# Patient Record
Sex: Male | Born: 1975 | Race: White | Hispanic: No | Marital: Married | Smoking: Former smoker
Health system: Southern US, Community
[De-identification: ages and names within clinical notes are randomized; demographics above are authoritative.]

## PROBLEM LIST (undated history)

## (undated) DIAGNOSIS — G4730 Sleep apnea, unspecified: Secondary | ICD-10-CM

## (undated) DIAGNOSIS — K219 Gastro-esophageal reflux disease without esophagitis: Secondary | ICD-10-CM

## (undated) DIAGNOSIS — G4733 Obstructive sleep apnea (adult) (pediatric): Secondary | ICD-10-CM

## (undated) DIAGNOSIS — R161 Splenomegaly, not elsewhere classified: Secondary | ICD-10-CM

## (undated) DIAGNOSIS — R197 Diarrhea, unspecified: Secondary | ICD-10-CM

## (undated) HISTORY — DX: Gilbert syndrome: E80.4

## (undated) HISTORY — DX: Sleep apnea, unspecified: G47.30

## (undated) HISTORY — DX: Gastro-esophageal reflux disease without esophagitis: K21.9

## (undated) HISTORY — DX: Diarrhea, unspecified: R19.7

## (undated) HISTORY — PX: WISDOM TOOTH EXTRACTION: SHX21

## (undated) HISTORY — PX: COLONOSCOPY: SHX174

## (undated) HISTORY — DX: Splenomegaly, not elsewhere classified: R16.1

## (undated) HISTORY — DX: Obstructive sleep apnea (adult) (pediatric): G47.33

---

## 2005-01-18 HISTORY — PX: CHOLECYSTECTOMY: SHX55

## 2010-06-04 DIAGNOSIS — R079 Chest pain, unspecified: Secondary | ICD-10-CM

## 2010-06-30 ENCOUNTER — Encounter: Payer: Self-pay | Admitting: *Deleted

## 2010-07-07 ENCOUNTER — Ambulatory Visit (INDEPENDENT_AMBULATORY_CARE_PROVIDER_SITE_OTHER): Payer: BC Managed Care – PPO | Admitting: Cardiology

## 2010-07-07 ENCOUNTER — Encounter: Payer: Self-pay | Admitting: Cardiology

## 2010-07-07 DIAGNOSIS — O99334 Smoking (tobacco) complicating childbirth: Secondary | ICD-10-CM

## 2010-07-07 DIAGNOSIS — R079 Chest pain, unspecified: Secondary | ICD-10-CM

## 2010-07-07 NOTE — Assessment & Plan Note (Signed)
I did discuss with him the need to stop all tobacco products.

## 2010-07-07 NOTE — Assessment & Plan Note (Signed)
I think the patient's chest pain is atypical and he had a negative treadmill test. No further cardiovascular testing is suggested. I did suggest aggressive risk reduction given his family history. I am sure that [facility], MD has checked his lipids and I have asked the patient to review these results with a low threshold for statins.

## 2010-07-07 NOTE — Progress Notes (Signed)
HPI The patient for evaluation of chest discomfort. He was hospitalized briefly recently with chest discomfort and ruled out for myocardial infarction. This was about one month ago. It started with his heartbeat in his head followed by headaches. He subsequently had some right-sided chest discomfort at rest it came and went. When he had radiation to his jaw into his right arm he presented to the emergency room. CT Scan demonstrated no pulmonary emboli. He ruled out for myocardial infarction and had a negative adequate exercise treadmill test. Following that he did have some lightheadedness and occasional headaches. He had one episode of left-sided burning discomfort. However, he said no further chest pain, neck or arm discomfort. 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 He said no increased dyspnea, PND or orthopnea. He said no palpitations, presyncope or syncope.  No Known Allergies  Current Outpatient Prescriptions  Medication Sig Dispense Refill  . APAP-Isometheptene-Dichloral (MIDRIN) 325-65-100 MG CAPS Take 1 capsule by mouth as needed.        . clonazePAM (KLONOPIN) 0.5 MG tablet Take 1 tablet by mouth Three times a day.      . cyclobenzaprine (FLEXERIL) 10 MG tablet Take 10 mg by mouth 3 (three) times daily as needed.       Marland Kitchen HYDROcodone-acetaminophen (VICODIN) 5-500 MG per tablet Take 1 tablet by mouth every 6 (six) hours as needed.        . nabumetone (RELAFEN) 500 MG tablet Take 500 mg by mouth 2 (two) times daily.        . sertraline (ZOLOFT) 50 MG tablet Take 1 tablet by mouth Daily.      Marland Kitchen DISCONTD: nabumetone (RELAFEN) 750 MG tablet Take 750 mg by mouth 2 (two) times daily.          Past Medical History  Diagnosis Date  . Diarrhea     predominant irritable syndrome  . Gilbert's disease   . Obstructive sleep apnea     on CPAP  . GERD (gastroesophageal reflux disease)   . Spleen enlarged     Past Surgical History   Procedure Date  . Cholecystectomy 05-31-05  . Wisdom tooth extraction     Family History  Problem Relation Age of Onset  . Heart attack Father 69    multiple stents,passed away in 31-May-2008 with multiple complications  . Multiple sclerosis Father   . Coronary artery disease Maternal Grandmother     bypass surgery times 5 vessel  . Coronary artery disease Maternal Uncle     with stents and bypass  . Heart attack Paternal Grandmother     History   Social History  . Marital Status: Married    Spouse Name: N/A    Number of Children: N/A  . Years of Education: N/A   Occupational History  . Not on file.   Social History Main Topics  . Smoking status: Former Smoker -- 2.0 packs/day for 10 years    Types: Cigarettes    Quit date: 01/19/2008  . Smokeless tobacco: Current User    Types: Snuff   Comment: dip 1.5 cans snuff/day  . Alcohol Use: No  . Drug Use: No  . Sexually Active: Not on file   Other Topics Concern  . Not on file   Social History Narrative   Married with 3 sons ages 85,6,and 2Employed at department of transportationHas 3 brothers and 2 sisters who are overall in good  health    ROS:  As stated in the HPI and negative for all other systems.   PHYSICAL EXAM BP 107/74  Pulse 74  Ht 6\' 1"  (1.854 m)  Wt 197 lb (89.359 kg)  BMI 25.99 kg/m2 GENERAL:  Well appearing HEENT:  Pupils equal round and reactive, fundi not visualized, oral mucosa unremarkable NECK:  No jugular venous distention, waveform within normal limits, carotid upstroke brisk and symmetric, no bruits, no thyromegaly LYMPHATICS:  No cervical, inguinal adenopathy LUNGS:  Clear to auscultation bilaterally BACK:  No CVA tenderness CHEST:  Unremarkable HEART:  PMI not displaced or sustained,S1 and S2 within normal limits, no S3, no S4, no clicks, no rubs, no murmurs ABD:  Flat, positive bowel sounds normal in frequency in pitch, no bruits, no rebound, no guarding, no midline pulsatile mass, no  hepatomegaly, no splenomegaly EXT:  2 plus pulses throughout, no edema, no cyanosis no clubbing SKIN:  No rashes no nodules NEURO:  Cranial nerves II through XII grossly intact, motor grossly intact throughout [facility]:  Cognitively intact, oriented to person place and time  EKG:  06/06/10  Sinus rhythm, rate 97, axis within normal limits, intervals within normal limits, no acute ST-T wave changes  ASSESSMENT AND PLAN

## 2011-07-25 ENCOUNTER — Encounter: Payer: Self-pay | Admitting: Physician Assistant

## 2011-07-25 DIAGNOSIS — R079 Chest pain, unspecified: Secondary | ICD-10-CM

## 2011-07-26 ENCOUNTER — Encounter: Payer: Self-pay | Admitting: Physician Assistant

## 2011-07-26 DIAGNOSIS — R072 Precordial pain: Secondary | ICD-10-CM

## 2011-08-04 ENCOUNTER — Telehealth: Payer: Self-pay | Admitting: *Deleted

## 2011-08-04 NOTE — Telephone Encounter (Signed)
The stress test was negative and there was no objective evidence of ischemia. He needs to see whomever is covering for Dr Neita Carp to evaluate possible nonanginal chest pain.

## 2011-08-04 NOTE — Telephone Encounter (Signed)
Patient calling because he has continued to have issues with chest pain since being d/c from [facility] 07/26/11. Patient is wanting to know what he is supposed to do moving forward about having futher cardiac work up for recommended CTA or heart cath since his stress test was negative. Sasser out of office until tomorrow and patient was informed to call our office.

## 2011-08-05 NOTE — Telephone Encounter (Signed)
Patient informed and verbalized understanding of plan. 

## 2011-08-06 ENCOUNTER — Telehealth: Payer: Self-pay

## 2011-08-06 ENCOUNTER — Encounter: Payer: Self-pay | Admitting: Physician Assistant

## 2011-08-06 ENCOUNTER — Other Ambulatory Visit: Payer: Self-pay | Admitting: Physician Assistant

## 2011-08-06 ENCOUNTER — Ambulatory Visit (INDEPENDENT_AMBULATORY_CARE_PROVIDER_SITE_OTHER): Payer: BC Managed Care – PPO | Admitting: Physician Assistant

## 2011-08-06 ENCOUNTER — Encounter: Payer: Self-pay | Admitting: *Deleted

## 2011-08-06 VITALS — BP 121/86 | HR 81 | Ht 73.0 in | Wt 204.0 lb

## 2011-08-06 DIAGNOSIS — Z01810 Encounter for preprocedural cardiovascular examination: Secondary | ICD-10-CM

## 2011-08-06 DIAGNOSIS — R072 Precordial pain: Secondary | ICD-10-CM

## 2011-08-06 LAB — PROTIME-INR

## 2011-08-06 MED ORDER — NITROGLYCERIN 0.4 MG SL SUBL: 0.4000 mg | SUBLINGUAL_TABLET | SUBLINGUAL | Status: DC | PRN

## 2011-08-06 MED ORDER — ASPIRIN EC 81 MG PO TBEC: 81.0000 mg | DELAYED_RELEASE_TABLET | Freq: Every day | ORAL | Status: AC

## 2011-08-06 NOTE — Assessment & Plan Note (Addendum)
Patient will be scheduled for elective cardiac catheterization JV lab next week, for definitive exclusion of significant CAD. He presents with atypical symptoms, but has had refractory CP since recent hospitalization. He ruled out for MI with normal markers, and had a negative GXT Myoview. He also had a negative GXT in May 2012. He prefers complete reassurance that these symptoms are not cardiac in etiology, fully aware that he does have underlying anxiety disorder. Of note, the risks/benefits of the procedure were discussed with the patient, and Dr. Mady Gemma approved of this plan. Pending completion of his cardiac evaluation, patient will be started on low-dose aspirin and provided with a prescription for prn NTG. If this study is negative, then he is cleared to resume followup with Dr. Neita Carp. If, however, there is evidence of significant CAD, then we will arrange to have the patient resume followup with Dr. Antoine Poche, here in the [place] clinic.

## 2011-08-06 NOTE — Telephone Encounter (Signed)
Pt has [employer], no precert required for heart cath. °

## 2011-08-06 NOTE — Progress Notes (Signed)
Primary Cardiologist: James Hochrein, MD   HPI: Patient is now referred, per Dr. Sasser's request, for recommended cardiac catheterization. He presents with no known CAD, and had a recent in-house evaluation for CP, here at [facility]. Serial cardiac markers were normal and a GXT Myoview, reviewed by Dr. Katz, was negative for evidence of scar/ischemia; EF 52% with normal wall motion.  Since discharge, he has been experiencing CP on a daily basis. He refers to a constant "tightness", as well as intermittent episodes of chest pain, which are unpredictable in onset. He denies any strict correlation with exertion. He suggests that these symptoms are dissimilar from his typical reflux symptoms. He suggests some pleuritic component, but also some exacerbation with moderate exertion. He denies any exacerbation with movement of arms or torso. He reports having undergone prior cholecystectomy.  Cardiac risk factors notable for tobacco (chewing), with remote history of smoking, and FH of premature CAD. He denies HTN, DM, and had an LDL of 103, by recent lipid profile.  Patient was seen by Dr. Sasser in the office yesterday, with then conferred with Dr. Hochrein regarding the patient's complaint of refractory CP. Dr. Hochrein agreed to have the patient seen in our office today, so as to proceed with scheduling for an elective cardiac catheterization procedure.  No Known Allergies  Current Outpatient Prescriptions  Medication Sig Dispense Refill  . clonazePAM (KLONOPIN) 0.5 MG tablet Take 1 tablet by mouth 3 (three) times daily as needed.       . pantoprazole (PROTONIX) 40 MG tablet Take 1 tablet by mouth Daily.      . sertraline (ZOLOFT) 50 MG tablet Take 1 tablet by mouth Daily.        Past Medical History  Diagnosis Date  . Diarrhea     predominant irritable syndrome  . Gilbert's disease   . Obstructive sleep apnea     on CPAP  . GERD (gastroesophageal reflux disease)   . Spleen enlarged      Past Surgical History  Procedure Date  . Cholecystectomy 2007  . Wisdom tooth extraction     History   Social History  . Marital Status: Married    Spouse Name: N/A    Number of Children: N/A  . Years of Education: N/A   Occupational History  . Not on file.   Social History Main Topics  . Smoking status: Former Smoker -- 2.0 packs/day for 10 years    Types: Cigarettes    Quit date: 01/19/2008  . Smokeless tobacco: Current User    Types: Snuff   Comment: dip 1.5 cans snuff/day  . Alcohol Use: No  . Drug Use: No  . Sexually Active: Not on file   Other Topics Concern  . Not on file   Social History Narrative   Married with 3 sons ages 9,6,and 2Employed at department of transportationHas 3 brothers and 2 sisters who are overall in good health   Social History Narrative   Married with 3 sons ages 9,6,and 2Employed at department of transportationHas 3 brothers and 2 sisters who are overall in good health    Problem Relation Age of Onset  . Heart attack Father 50    multiple stents,passed away in 2010 with multiple complications  . Multiple sclerosis Father   . Coronary artery disease Maternal Grandmother     bypass surgery times 5 vessel  . Coronary artery disease Maternal Uncle     with stents and bypass  . Heart attack Paternal Grandmother       ROS: no nausea, vomiting; no fever, chills; no melena, hematochezia; no claudication  PHYSICAL EXAM: BP 121/86  Pulse 81  Ht 6' 1" (1.854 m)  Wt 204 lb (92.534 kg)  BMI 26.91 kg/m2 GENERAL: 36-year-old male; NAD HEENT: NCAT, PERRLA, EOMI; sclera clear; no xanthelasma NECK: palpable bilateral carotid pulses, no bruits; no JVD; no TM LUNGS: CTA bilaterally CARDIAC: RRR (S1, S2); no significant murmurs; no rubs or gallops ABDOMEN: soft, non-tender; intact BS EXTREMETIES: intact distal pulses; no significant peripheral edema SKIN: warm/dry; no obvious rash/lesions MUSCULOSKELETAL: no joint deformity NEURO: no focal  deficit; NL affect   EKG:    ASSESSMENT & PLAN:  Chest pain Patient will be scheduled for elective cardiac catheterization JV lab next week, for definitive exclusion of significant CAD. He presents with atypical symptoms, but has had refractory CP since recent hospitalization. He ruled out for MI with normal markers, and had a negative GXT Myoview. He also had a negative GXT in May 2012. He prefers complete reassurance that these symptoms are not cardiac in etiology, fully aware that he does have underlying anxiety disorder. Of note, the risks/benefits of the procedure were discussed with the patient, and Dr. Mohammed Arida approved of this plan. Pending completion of his cardiac evaluation, patient will be started on low-dose aspirin and provided with a prescription for prn NTG. If this study is negative, then he is cleared to resume followup with Dr. Sasser. If, however, there is evidence of significant CAD, then we will arrange to have the patient resume followup with Dr. Hochrein, here in the [place] clinic.    Shaun Boyer, PAC  

## 2011-08-06 NOTE — Patient Instructions (Addendum)
   Left heat cath - see info sheet given  Begin Aspirin 81mg  daily  Nitroglycerin as needed for severe chest pain only Follow up will be given at time of discharge

## 2011-08-06 NOTE — Telephone Encounter (Signed)
Left heart cath - Tuesday, July 23 - Cooper - 7:30 - JV

## 2011-08-10 ENCOUNTER — Encounter (HOSPITAL_BASED_OUTPATIENT_CLINIC_OR_DEPARTMENT_OTHER)
Admission: RE | Disposition: A | Discharge status: Home / Self Care | Payer: Self-pay | Source: Ambulatory Visit | Attending: Cardiovascular Disease

## 2011-08-10 ENCOUNTER — Inpatient Hospital Stay (HOSPITAL_BASED_OUTPATIENT_CLINIC_OR_DEPARTMENT_OTHER)
Admission: RE | Admit: 2011-08-10 | Discharge: 2011-08-10 | Disposition: A | Discharge status: Home / Self Care | Payer: BC Managed Care – PPO | Source: Ambulatory Visit | Attending: Cardiovascular Disease | Admitting: Cardiovascular Disease

## 2011-08-10 ENCOUNTER — Encounter (HOSPITAL_BASED_OUTPATIENT_CLINIC_OR_DEPARTMENT_OTHER): Payer: Self-pay | Admitting: *Deleted

## 2011-08-10 DIAGNOSIS — R079 Chest pain, unspecified: Secondary | ICD-10-CM | POA: Insufficient documentation

## 2011-08-10 DIAGNOSIS — K219 Gastro-esophageal reflux disease without esophagitis: Secondary | ICD-10-CM | POA: Insufficient documentation

## 2011-08-10 DIAGNOSIS — R161 Splenomegaly, not elsewhere classified: Secondary | ICD-10-CM | POA: Insufficient documentation

## 2011-08-10 DIAGNOSIS — G4733 Obstructive sleep apnea (adult) (pediatric): Secondary | ICD-10-CM | POA: Insufficient documentation

## 2011-08-10 SURGERY — JV LEFT HEART CATHETERIZATION WITH CORONARY ANGIOGRAM
Anesthesia: Moderate Sedation

## 2011-08-10 MED ORDER — ONDANSETRON HCL 4 MG/2ML IJ SOLN: 4.0000 mg | Freq: Four times a day (QID) | INTRAMUSCULAR | Status: DC | PRN

## 2011-08-10 MED ORDER — DIAZEPAM 5 MG PO TABS
5.0000 mg | ORAL_TABLET | Freq: Once | ORAL | Status: AC
  Administered 2011-08-10: 5 mg via ORAL

## 2011-08-10 MED ORDER — ACETAMINOPHEN 325 MG PO TABS: 650.0000 mg | ORAL_TABLET | ORAL | Status: DC | PRN

## 2011-08-10 MED ORDER — SODIUM CHLORIDE 0.9 % IV SOLN: 1.0000 mL/kg/h | INTRAVENOUS | Status: DC

## 2011-08-10 NOTE — OR Nursing (Signed)
Dr Cooper at bedside to discuss results and treatment plan with pt and family 

## 2011-08-10 NOTE — OR Nursing (Signed)
Meal served 

## 2011-08-10 NOTE — OR Nursing (Signed)
Discharge instructions reviewed and signed, pt stated understanding, ambulated in hall without difficulty, site level 0, transported to wife's car via wheelchair 

## 2011-08-10 NOTE — Interval H&P Note (Signed)
History and Physical Interval Note:  08/10/2011 7:43 AM  Shaun Boyer  has presented today for surgery, with the diagnosis of chest pain  The various methods of treatment have been discussed with the patient and family. After consideration of risks, benefits and other options for treatment, the patient has consented to  Procedure(s) (LRB): JV LEFT HEART CATHETERIZATION WITH CORONARY ANGIOGRAM (N/A) as a surgical intervention .  The patient's history has been reviewed, patient examined, no change in status, stable for surgery.  I have reviewed the patient's chart and labs.  Questions were answered to the patient's satisfaction.     [name]  08/10/2011 7:43 AM

## 2011-08-10 NOTE — CV Procedure (Signed)
   Cardiac Catheterization Procedure Note  Name: PATON CRUM MRN: [id number] DOB: January 12, 1976  Procedure: Left Heart Cath, Selective Coronary Angiography, LV angiography  Indication: Recurrent chest pain with multiple cardiovascular risk factors.   Procedural Details: The right wrist was prepped, draped, and anesthetized with 1% lidocaine. Using the modified Seldinger technique, a 5 French sheath was introduced into the right radial artery. 3 mg of verapamil was administered through the sheath, weight-based unfractionated heparin was administered intravenously. Standard Judkins catheters were used for selective coronary angiography and left ventriculography. Catheter exchanges were performed over an exchange length guidewire. There were no immediate procedural complications. A TR band was used for radial hemostasis at the completion of the procedure.  The patient was transferred to the post catheterization recovery area for further monitoring.  Procedural Findings: Hemodynamics: AO 97/67 with a mean of 81 LV 102/16  Coronary angiography: Coronary dominance: right  Left mainstem: Widely patent with no obstructive disease.  Left anterior descending (LAD): The LAD reaches the left ventricular apex. The vessel is widely patent throughout. There is a large first septal perforator. The first diagonal is small. The second diagonal is of moderate caliber. There is no obstructive disease visualized throughout the course of the LAD territory.  Left circumflex (LCx): There is a large intermediate branch that divides into twin vessels. There is no significant disease seen throughout the course of the intermediate. The AV groove circumflex gives off a tiny first OM and a moderate caliber second OM without obstructive CAD.  Right coronary artery (RCA): Large, dominant vessel. There is no obstructive disease present. The vessel supplies the PDA branch and a small posterolateral branch.  Left  ventriculography: Left ventricular systolic function is normal, LVEF is estimated at 55-65%, there is no significant mitral regurgitation   Final Conclusions:   1. Widely patent coronary arteries 2. Normal left ventricular function  Recommendations: Reassurance.  [name] 08/10/2011, 8:13 AM

## 2011-08-10 NOTE — H&P (View-Only) (Signed)
Primary Cardiologist: Rollene Rotunda, MD   HPI: Patient is now referred, per Dr. Dian Situ request, for recommended cardiac catheterization. He presents with no known CAD, and had a recent in-house evaluation for CP, here at [facility]. Serial cardiac markers were normal and a GXT Myoview, reviewed by Dr. Myrtis Ser, was negative for evidence of scar/ischemia; EF 52% with normal wall motion.  Since discharge, he has been experiencing CP on a daily basis. He refers to a constant "tightness", as well as intermittent episodes of chest pain, which are unpredictable in onset. He denies any strict correlation with exertion. He suggests that these symptoms are dissimilar from his typical reflux symptoms. He suggests some pleuritic component, but also some exacerbation with moderate exertion. He denies any exacerbation with movement of arms or torso. He reports having undergone prior cholecystectomy.  Cardiac risk factors notable for tobacco (chewing), with remote history of smoking, and FH of premature CAD. He denies HTN, DM, and had an LDL of 103, by recent lipid profile.  Patient was seen by Dr. Neita Carp in the office yesterday, with then conferred with Dr. Antoine Poche regarding the patient's complaint of refractory CP. Dr. Antoine Poche agreed to have the patient seen in our office today, so as to proceed with scheduling for an elective cardiac catheterization procedure.  No Known Allergies  Current Outpatient Prescriptions  Medication Sig Dispense Refill  . clonazePAM (KLONOPIN) 0.5 MG tablet Take 1 tablet by mouth 3 (three) times daily as needed.       . pantoprazole (PROTONIX) 40 MG tablet Take 1 tablet by mouth Daily.      . sertraline (ZOLOFT) 50 MG tablet Take 1 tablet by mouth Daily.        Past Medical History  Diagnosis Date  . Diarrhea     predominant irritable syndrome  . Gilbert's disease   . Obstructive sleep apnea     on CPAP  . GERD (gastroesophageal reflux disease)   . Spleen enlarged      Past Surgical History  Procedure Date  . Cholecystectomy 07/09/2005  . Wisdom tooth extraction     History   Social History  . Marital Status: Married    Spouse Name: N/A    Number of Children: N/A  . Years of Education: N/A   Occupational History  . Not on file.   Social History Main Topics  . Smoking status: Former Smoker -- 2.0 packs/day for 10 years    Types: Cigarettes    Quit date: 01/19/2008  . Smokeless tobacco: Current User    Types: Snuff   Comment: dip 1.5 cans snuff/day  . Alcohol Use: No  . Drug Use: No  . Sexually Active: Not on file   Other Topics Concern  . Not on file   Social History Narrative   Married with 3 sons ages 1,6,and 2Employed at department of transportationHas 3 brothers and 2 sisters who are overall in good health   Social History Narrative   Married with 3 sons ages 83,6,and 2Employed at department of transportationHas 3 brothers and 2 sisters who are overall in good health    Problem Relation Age of Onset  . Heart attack Father 74    multiple stents,passed away in Jul 09, 2008 with multiple complications  . Multiple sclerosis Father   . Coronary artery disease Maternal Grandmother     bypass surgery times 5 vessel  . Coronary artery disease Maternal Uncle     with stents and bypass  . Heart attack Paternal Grandmother  ROS: no nausea, vomiting; no fever, chills; no melena, hematochezia; no claudication  PHYSICAL EXAM: BP 121/86  Pulse 81  Ht 6\' 1"  (1.854 m)  Wt 204 lb (92.534 kg)  BMI 26.91 kg/m2 GENERAL: 36 year old male; NAD HEENT: NCAT, PERRLA, EOMI; sclera clear; no xanthelasma NECK: palpable bilateral carotid pulses, no bruits; no JVD; no TM LUNGS: CTA bilaterally CARDIAC: RRR (S1, S2); no significant murmurs; no rubs or gallops ABDOMEN: soft, non-tender; intact BS EXTREMETIES: intact distal pulses; no significant peripheral edema SKIN: warm/dry; no obvious rash/lesions MUSCULOSKELETAL: no joint deformity NEURO: no focal  deficit; NL affect   EKG:    ASSESSMENT & PLAN:  Chest pain Patient will be scheduled for elective cardiac catheterization JV lab next week, for definitive exclusion of significant CAD. He presents with atypical symptoms, but has had refractory CP since recent hospitalization. He ruled out for MI with normal markers, and had a negative GXT Myoview. He also had a negative GXT in May 2012. He prefers complete reassurance that these symptoms are not cardiac in etiology, fully aware that he does have underlying anxiety disorder. Of note, the risks/benefits of the procedure were discussed with the patient, and Dr. Mady Gemma approved of this plan. Pending completion of his cardiac evaluation, patient will be started on low-dose aspirin and provided with a prescription for prn NTG. If this study is negative, then he is cleared to resume followup with Dr. Neita Carp. If, however, there is evidence of significant CAD, then we will arrange to have the patient resume followup with Dr. Antoine Poche, here in the [place] clinic.    Gene Tarica Harl, PAC

## 2018-02-16 ENCOUNTER — Ambulatory Visit (INDEPENDENT_AMBULATORY_CARE_PROVIDER_SITE_OTHER): Payer: Managed Care, Other (non HMO) | Admitting: Orthopaedic Surgery

## 2018-02-16 ENCOUNTER — Encounter (INDEPENDENT_AMBULATORY_CARE_PROVIDER_SITE_OTHER): Payer: Self-pay | Admitting: Orthopaedic Surgery

## 2018-02-16 VITALS — BP 127/64 | HR 80 | Ht 73.0 in | Wt 230.0 lb

## 2018-02-16 DIAGNOSIS — M533 Sacrococcygeal disorders, not elsewhere classified: Secondary | ICD-10-CM

## 2018-02-16 DIAGNOSIS — Z720 Tobacco use: Secondary | ICD-10-CM | POA: Diagnosis not present

## 2018-02-16 NOTE — Progress Notes (Signed)
Office Visit Note/orthopedic consultation   Patient: Shaun Boyer           Date of Birth: 1975-05-05           MRN: [id number] Visit Date: 02/16/2018              Requested by: Sasser, Clarene Critchley, MD [address] [place], [place] [address] PCP: Estanislado Pandy, MD   Assessment & Plan: Visit Diagnoses:  1. Coccyx pain   2. Tobacco use     Plan: With patient's intermittent symptoms without area of extreme tenderness with palpation this does not appear to be typical coccydynia.  He may have central disc protrusion with intermittent pain at the midline.  We will try prednisone pack I will recheck him back again in 3 weeks if he is having persistent problems we can consider obtaining MRI scan.  Thank you the opportunity to share in his care and see him in consultation.  Follow-Up Instructions: Return in about 3 weeks (around 03/09/2018).   Orders:  No orders of the defined types were placed in this encounter.  No orders of the defined types were placed in this encounter.     Procedures: No procedures performed   Clinical Data: No additional findings.   Subjective: Chief Complaint  Patient presents with  . Lower Back - Pain    HPI patient here for consultation concerning sacrum and coccyx pain at the request of Dr. Neita Carp.  Patient is a 43 year old male seen with complaints of pain at the sacrum coccyx junction.  Patient tends to come and go at times he rates it severe other times he has no pain.  He is used a donut cushion to some degree.  He was treated with some Augmentin one point for question of possible infection.  Patient works with Education officer, community.  He has been taking some Naprosyn and tramadol.  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  He has had history of disc protrusions related to [employer]. problem this was about 17 years ago.  At that time he had right leg symptoms and was current problem does not involve any symptoms with weakness numbness  or pain in either leg.  No associated bowel or bladder symptoms.  Sometimes notes pain when he is using the toilet sitting.  Patient had previous x-rays of in/01/2017 that showed some Schmorl's nodes in the lumbar spine with normal alignment normal disc space and normal coccyx and sacrum.  Review of Systems positive for a history of disc protrusions.,  Tobacco use, acid reflux and anxiety.  History of migraines and sleep apnea.   Objective: Vital Signs: BP 127/64   Pulse 80   Ht 6\' 1"  (1.854 m)   Wt 230 lb (104.3 kg)   BMI 30.34 kg/m   Physical Exam Constitutional:      Appearance: He is well-developed.  HENT:     Head: Normocephalic and atraumatic.  Eyes:     Pupils: Pupils are equal, round, and reactive to light.  Neck:     Thyroid: No thyromegaly.     Trachea: No tracheal deviation.  Cardiovascular:     Rate and Rhythm: Normal rate.  Pulmonary:     Effort: Pulmonary effort is normal.     Breath sounds: No wheezing.  Abdominal:     General: Bowel sounds are normal.     Palpations: Abdomen is soft.  Skin:    General: Skin is warm and  dry.     Capillary Refill: Capillary refill takes less than 2 seconds.  Neurological:     Mental Status: He is alert and oriented to person, place, and time.  Psychiatric:        Behavior: Behavior normal.        Thought Content: Thought content normal.        Judgment: Judgment normal.     Ortho Exam patient has normal hip range of motion normal gait normal heel toe walking no isolated motor weakness.  Reflexes are 3+ and symmetrical knee and ankle jerk.  Tenderness over the coccyx sacral junction without palpable area of extreme tenderness.  No midline defects, no pilonidal cyst.  Skin is intact.  Specialty Comments:  No specialty comments available.  Imaging: No results found.   PMFS History: Patient Active Problem List   Diagnosis Date Noted  . Tobacco use 02/16/2018  . Chest pain 07/07/2010   Past Medical History:    Diagnosis Date  . Diarrhea    predominant irritable syndrome  . GERD (gastroesophageal reflux disease)   . Gilbert's disease   . Obstructive sleep apnea    on CPAP  . Spleen enlarged     Family History  Problem Relation Age of Onset  . Heart attack Father [id number]       multiple stents,passed away in 2010 with multiple complications  . Multiple sclerosis Father   . Coronary artery disease Maternal Grandmother        bypass surgery times 5 vessel  . Coronary artery disease Maternal Uncle        with stents and bypass  . Heart attack Paternal Grandmother     Past Surgical History:  Procedure Laterality Date  . CHOLECYSTECTOMY  2007  . WISDOM TOOTH EXTRACTION     Social History   Occupational History  . Not on file  Tobacco Use  . Smoking status: Former Smoker    Packs/day: 2.00    Years: 10.00    Pack years: 20.00    Types: Cigarettes    Last attempt to quit: 01/19/2008    Years since quitting: 10.0  . Smokeless tobacco: Current User    Types: Snuff  . Tobacco comment: dip 1.5 cans snuff/day  Substance and Sexual Activity  . Alcohol use: No  . Drug use: No  . Sexual activity: Not on file

## 2018-03-09 ENCOUNTER — Telehealth (INDEPENDENT_AMBULATORY_CARE_PROVIDER_SITE_OTHER): Payer: Self-pay | Admitting: Radiology

## 2018-03-09 ENCOUNTER — Ambulatory Visit (INDEPENDENT_AMBULATORY_CARE_PROVIDER_SITE_OTHER): Payer: BC Managed Care – PPO | Admitting: Orthopaedic Surgery

## 2018-03-09 ENCOUNTER — Encounter (INDEPENDENT_AMBULATORY_CARE_PROVIDER_SITE_OTHER): Payer: Self-pay | Admitting: Orthopaedic Surgery

## 2018-03-09 VITALS — BP 118/93 | HR 74 | Ht 73.0 in | Wt 230.0 lb

## 2018-03-09 DIAGNOSIS — M545 Low back pain, unspecified: Secondary | ICD-10-CM

## 2018-03-09 DIAGNOSIS — M533 Sacrococcygeal disorders, not elsewhere classified: Secondary | ICD-10-CM

## 2018-03-09 MED ORDER — TRAMADOL HCL 50 MG PO TABS: 50.0000 mg | ORAL_TABLET | Freq: Four times a day (QID) | ORAL | 0 refills | Status: DC | PRN

## 2018-03-09 NOTE — Telephone Encounter (Signed)
Patient called and states [facility] does not have the rx for his tramadol refill. Rx info was left on [employer] I called pharmacy. They pulled info off of voicemail and are filling rx.  I called patient and advised.

## 2018-03-09 NOTE — Progress Notes (Signed)
Office Visit Note   Patient: Shaun Boyer           Date of Birth: 1975-11-17           MRN: [id number] Visit Date: 03/09/2018              Requested by: Sasser, Clarene Critchley, MD [address] [place], [place] [address] PCP: Estanislado Pandy, MD   Assessment & Plan: Visit Diagnoses:  1. Coccyx pain   2. Midline low back pain, unspecified chronicity, unspecified whether sciatica present     Plan: Patient has a past history of disc protrusions's having severe pain is bothering him with working.  He is having problems sitting walking standing.  He got some temporary relief with the prednisone and now is a significant pain again currently.  We will proceed with MRI scan lumbar and office follow-up after scan for review.  Follow-Up Instructions: No follow-ups on file.   Orders:  Orders Placed This Encounter  Procedures  . MR Lumbar Spine w/o contrast   Meds ordered this encounter  Medications  . traMADol (ULTRAM) 50 MG tablet    Sig: Take 1 tablet (50 mg total) by mouth every 6 (six) hours as needed.    Dispense:  30 tablet    Refill:  0      Procedures: No procedures performed   Clinical Data: No additional findings.   Subjective: Chief Complaint  Patient presents with  . Lower Back - Pain, Follow-up    Coccyx pain    HPI 43 year old male returns with ongoing severe sacral and coccyx pain.  He took the prednisone Dosepak states he was significantly better but his symptoms the prednisone ran out he had recurrence of pain exactly as before.  He has pain tends to come and go and that it worse is severe he is used a donut cushion.  He took some Augmentin for possible prostatitis without relief he is used Naprosyn as well as tramadol for pain prednisone Dosepak as listed above.  Past history of disc protrusion about 17 years ago with the Worker's Comp. related injury.  No dysuria no urgency no urinary frequency.  Denies fever or chills.  Review of Systems positive tobacco abuse acid  reflux anxiety history of migraine sleep apnea previous disc protrusions noted on MRI about 17 years ago.   Objective: Vital Signs: BP (!) 118/93   Pulse 74   Ht 6\' 1"  (1.854 m)   Wt 230 lb (104.3 kg)   BMI 30.34 kg/m   Physical Exam Constitutional:      Appearance: He is well-developed.  HENT:     Head: Normocephalic and atraumatic.  Eyes:     Pupils: Pupils are equal, round, and reactive to light.  Neck:     Thyroid: No thyromegaly.     Trachea: No tracheal deviation.  Cardiovascular:     Rate and Rhythm: Normal rate.  Pulmonary:     Effort: Pulmonary effort is normal.     Breath sounds: No wheezing.  Abdominal:     General: Bowel sounds are normal.     Palpations: Abdomen is soft.  Skin:    General: Skin is warm and dry.     Capillary Refill: Capillary refill takes less than 2 seconds.  Neurological:     Mental Status: He is alert and oriented to person, place, and time.  Psychiatric:        Behavior: Behavior normal.  Thought Content: Thought content normal.        Judgment: Judgment normal.     Ortho Exam patient has pain with palpation of the sacrum and coccyx sciatic notch tenderness right and left.  Reflexes are 3+ knee and ankle jerk right and left.  Anterior tib gastrocsoleus is strong.  He still able heel and toe walk.  Specialty Comments:  No specialty comments available.  Imaging: No results found.   PMFS History: Patient Active Problem List   Diagnosis Date Noted  . Tobacco use 02/16/2018  . Chest pain 07/07/2010   Past Medical History:  Diagnosis Date  . Diarrhea    predominant irritable syndrome  . GERD (gastroesophageal reflux disease)   . Gilbert's disease   . Obstructive sleep apnea    on CPAP  . Spleen enlarged     Family History  Problem Relation Age of Onset  . Heart attack Father 41       multiple stents,passed away in 2008/05/18 with multiple complications  . Multiple sclerosis Father   . Coronary artery disease Maternal  Grandmother        bypass surgery times 5 vessel  . Coronary artery disease Maternal Uncle        with stents and bypass  . Heart attack Paternal Grandmother     Past Surgical History:  Procedure Laterality Date  . CHOLECYSTECTOMY  2005/05/18  . WISDOM TOOTH EXTRACTION     Social History   Occupational History  . Not on file  Tobacco Use  . Smoking status: Former Smoker    Packs/day: 2.00    Years: 10.00    Pack years: 20.00    Types: Cigarettes    Last attempt to quit: 01/19/2008    Years since quitting: 10.1  . Smokeless tobacco: Current User    Types: Snuff  . Tobacco comment: dip 1.5 cans snuff/day  Substance and Sexual Activity  . Alcohol use: No  . Drug use: No  . Sexual activity: Not on file

## 2018-03-17 ENCOUNTER — Telehealth (INDEPENDENT_AMBULATORY_CARE_PROVIDER_SITE_OTHER): Payer: Self-pay | Admitting: Radiology

## 2018-03-17 NOTE — Telephone Encounter (Signed)
Still awaiting denial documentation.

## 2018-03-17 NOTE — Telephone Encounter (Signed)
MRI Lumbar Spine was denied by patient's insurance. Shaun Boyer called patient and advised, and he questioned paying for the MRI out of pocket. He then called his insurance company who states that Dr. Ophelia Charter can do a peer to peer to get it approved. Shaun Boyer has called x2 but has still not received denial paperwork, only the phone call.  She does not have documentation on why the scan has been denied.  Will continue to check for denial paperwork to initiate next step for patient.     Case# [id number] Fax [phone]

## 2018-03-20 NOTE — Telephone Encounter (Signed)
Still have not received denial per [facility] in [place] office.

## 2018-03-23 ENCOUNTER — Ambulatory Visit (INDEPENDENT_AMBULATORY_CARE_PROVIDER_SITE_OTHER): Payer: Managed Care, Other (non HMO) | Admitting: Orthopaedic Surgery

## 2018-03-23 ENCOUNTER — Encounter (INDEPENDENT_AMBULATORY_CARE_PROVIDER_SITE_OTHER): Payer: Self-pay | Admitting: Orthopaedic Surgery

## 2018-03-23 VITALS — BP 126/91 | HR 90 | Ht 73.0 in | Wt 230.0 lb

## 2018-03-23 DIAGNOSIS — M545 Low back pain, unspecified: Secondary | ICD-10-CM

## 2018-03-23 NOTE — Progress Notes (Signed)
Office Visit Note   Patient: Shaun Boyer           Date of Birth: [birth year]           MRN: [id number] Visit Date: 03/23/2018              Requested by: Sasser, Clarene Critchley, MD [address] [place], [place] [address] PCP: Estanislado Pandy, MD   Assessment & Plan: Visit Diagnoses:  1. Midline low back pain without sciatica, unspecified chronicity     Plan: Patient is now had persistent pain for 2 to 3 months is gotten worse to the point where he he is missing work.  He was originally thought to might have some prostatitis was treated with antibiotics and then has been on anti-inflammatories for several months.  Is been 5 weeks since I first saw him and he has been on a prednisone Dosepak and then restarted the Naprosyn without relief.  MRI scan is indicated and we will resubmit the request.  Follow-Up Instructions: No follow-ups on file.   Orders:  No orders of the defined types were placed in this encounter.  No orders of the defined types were placed in this encounter.     Procedures: No procedures performed   Clinical Data: No additional findings.   Subjective: Chief Complaint  Patient presents with  . Lower Back - Pain, Follow-up    HPI 43 year old male returns with ongoing problems with back pain.  He states the pain is been severe and has been bad enough he had been able to work.  He is been having problems since I first saw him on 02/16/1998 20 pound in 5 weeks is been taking anti-inflammatories.  He states the pain is not better is having trouble sleeping problems walking.  He denies chills or fever no history of cancer.  He is now been 5 weeks with treatment with Naprosyn without improvement.  Patient was placed on a prednisone Dosepak and did not get relief.  He does have past history of problems with disc protrusion from an on-the-job Worker's Comp. injury which was about 17 years ago.  His pain is primarily midline sacrum and coccyx and he rates it at times is  severe.  Review of Systems updated unchanged from 130/20.  History of previous disc protrusion tobacco use acid reflux sleep apnea migraines and anxiety.   Objective: Vital Signs: BP (!) 126/91   Pulse 90   Ht 6\' 1"  (1.854 m)   Wt 230 lb (104.3 kg)   BMI 30.34 kg/m   Physical Exam Constitutional:      Appearance: He is well-developed.  HENT:     Head: Normocephalic and atraumatic.  Eyes:     Pupils: Pupils are equal, round, and reactive to light.  Neck:     Thyroid: No thyromegaly.     Trachea: No tracheal deviation.  Cardiovascular:     Rate and Rhythm: Normal rate.  Pulmonary:     Effort: Pulmonary effort is normal.     Breath sounds: No wheezing.  Abdominal:     General: Bowel sounds are normal.     Palpations: Abdomen is soft.  Skin:    General: Skin is warm and dry.     Capillary Refill: Capillary refill takes less than 2 seconds.  Neurological:     Mental Status: He is alert and oriented to person, place, and time.  Psychiatric:        Behavior: Behavior normal.  Thought Content: Thought content normal.        Judgment: Judgment normal.     Ortho Exam patient has good hip range of motion reflexes are intact.  He has pain with certain lumbar extension no pain with forward flexion.  Sensation is intact distal pulses are intact.  Anterior tib gastrocsoleus is active.  Specialty Comments:  No specialty comments available.  Imaging: No results found.   PMFS History: Patient Active Problem List   Diagnosis Date Noted  . Tobacco use 02/16/2018  . Chest pain 07/07/2010   Past Medical History:  Diagnosis Date  . Diarrhea    predominant irritable syndrome  . GERD (gastroesophageal reflux disease)   . Gilbert's disease   . Obstructive sleep apnea    on CPAP  . Spleen enlarged     Family History  Problem Relation Age of Onset  . Heart attack Father 63       multiple stents,passed away in May 07, 2008 with multiple complications  . Multiple sclerosis  Father   . Coronary artery disease Maternal Grandmother        bypass surgery times 5 vessel  . Coronary artery disease Maternal Uncle        with stents and bypass  . Heart attack Paternal Grandmother     Past Surgical History:  Procedure Laterality Date  . CHOLECYSTECTOMY  05/07/05  . WISDOM TOOTH EXTRACTION     Social History   Occupational History  . Not on file  Tobacco Use  . Smoking status: Former Smoker    Packs/day: 2.00    Years: 10.00    Pack years: 20.00    Types: Cigarettes    Last attempt to quit: 01/19/2008    Years since quitting: 10.1  . Smokeless tobacco: Current User    Types: Snuff  . Tobacco comment: dip 1.5 cans snuff/day  Substance and Sexual Activity  . Alcohol use: No  . Drug use: No  . Sexual activity: Not on file

## 2018-03-23 NOTE — Telephone Encounter (Signed)
Patient came in for appointment today. Dr. Ophelia Charter spoke with patient in regards to treatment and Claris Gower called the insurance company again. We will resubmit the MRI for authorization next week which will mark the 6 weeks since patient has been seeing Dr. Ophelia Charter. Patient is aware. Claris Gower has all information to be resubmitted.

## 2018-04-06 ENCOUNTER — Encounter (INDEPENDENT_AMBULATORY_CARE_PROVIDER_SITE_OTHER): Payer: Self-pay | Admitting: Orthopaedic Surgery

## 2018-04-06 ENCOUNTER — Ambulatory Visit (INDEPENDENT_AMBULATORY_CARE_PROVIDER_SITE_OTHER): Payer: Managed Care, Other (non HMO) | Admitting: Orthopaedic Surgery

## 2018-04-06 ENCOUNTER — Other Ambulatory Visit: Payer: Self-pay

## 2018-04-06 VITALS — Ht 73.0 in | Wt 230.0 lb

## 2018-04-06 DIAGNOSIS — M545 Low back pain, unspecified: Secondary | ICD-10-CM

## 2018-04-06 DIAGNOSIS — M5126 Other intervertebral disc displacement, lumbar region: Secondary | ICD-10-CM | POA: Diagnosis not present

## 2018-04-06 NOTE — Progress Notes (Signed)
Office Visit Note   Patient: Shaun Boyer           Date of Birth: 1975/12/15           MRN: [id number] Visit Date: 04/06/2018              Requested by: Sasser, Clarene Critchley, MD [address] [place], [place] [address] PCP: Estanislado Pandy, MD   Assessment & Plan: Visit Diagnoses:  1. Low back pain without sciatica, unspecified back pain laterality, unspecified chronicity   2. Protrusion of lumbar intervertebral disc     Plan: We discussed he needs to lose some weight stop smoking.  We will set him up for some physical therapy.  I discussed with him with absence of compressive lesions he needs to avoid narcotic medication and problems with prolonged narcotics discussed.  If the therapy smoking cessation and weight loss not effective will consider single epidural likely at the L5-S1 level centrally.  Recheck 7 weeks.  Follow-Up Instructions: Return in about 7 weeks (around 05/25/2018).   Orders:  No orders of the defined types were placed in this encounter.  No orders of the defined types were placed in this encounter.     Procedures: No procedures performed   Clinical Data: No additional findings.   Subjective: Chief Complaint  Patient presents with  . Lower Back - Pain    Lumbar MRI Review    HPI 43 year old male seen with continued low back pain axial radiates down to the coccyx.  Has no pain with rotation.  Denies any radicular leg symptoms.  MRI scan has been obtained and shows shallow right subarticular disc protrusion at L5-S1 contacting the nerve root without displacement.  Mild disc bulge with facet hypertrophy at L4-5 with mild right foraminal narrowing.  Tiny central disc extrusion at L1-2 without contact or stenosis.  No fever chills no bowel or bladder symptoms.  He is a smoker.  Prednisone did not give him relief.  He got some relief with tramadol.  He states he is almost out.  Review of Systems positive for tobacco abuse, smoking.  Chronic low back pain.  Positive for  anxiety, sleep apnea, migraines.   Objective: Vital Signs: Ht 6\' 1"  (1.854 m)   Wt 230 lb (104.3 kg)   BMI 30.34 kg/m   Physical Exam Constitutional:      Appearance: He is well-developed.  HENT:     Head: Normocephalic and atraumatic.  Eyes:     Pupils: Pupils are equal, round, and reactive to light.  Neck:     Thyroid: No thyromegaly.     Trachea: No tracheal deviation.  Cardiovascular:     Rate and Rhythm: Normal rate.  Pulmonary:     Effort: Pulmonary effort is normal.     Breath sounds: No wheezing.  Abdominal:     General: Bowel sounds are normal.     Palpations: Abdomen is soft.  Skin:    General: Skin is warm and dry.     Capillary Refill: Capillary refill takes less than 2 seconds.  Neurological:     Mental Status: He is alert and oriented to person, place, and time.  Psychiatric:        Behavior: Behavior normal.        Thought Content: Thought content normal.        Judgment: Judgment normal.     Ortho Exam patient is able heel and toe walk ambulatory without a limp gets from sitting to  standing comfortably.  Anterior tib gastrocsoleus is intact no atrophy no rash over exposed skin.  Specialty Comments:  No specialty comments available.  Imaging: No results found.   PMFS History: Patient Active Problem List   Diagnosis Date Noted  . Tobacco use 02/16/2018  . Chest pain 07/07/2010   Past Medical History:  Diagnosis Date  . Diarrhea    predominant irritable syndrome  . GERD (gastroesophageal reflux disease)   . Gilbert's disease   . Obstructive sleep apnea    on CPAP  . Spleen enlarged     Family History  Problem Relation Age of Onset  . Heart attack Father 68       multiple stents,passed away in 2008-05-12 with multiple complications  . Multiple sclerosis Father   . Coronary artery disease Maternal Grandmother        bypass surgery times 5 vessel  . Coronary artery disease Maternal Uncle        with stents and bypass  . Heart attack  Paternal Grandmother     Past Surgical History:  Procedure Laterality Date  . CHOLECYSTECTOMY  05/12/05  . WISDOM TOOTH EXTRACTION     Social History   Occupational History  . Not on file  Tobacco Use  . Smoking status: Former Smoker    Packs/day: 2.00    Years: 10.00    Pack years: 20.00    Types: Cigarettes    Last attempt to quit: 01/19/2008    Years since quitting: 10.2  . Smokeless tobacco: Current User    Types: Snuff  . Tobacco comment: dip 1.5 cans snuff/day  Substance and Sexual Activity  . Alcohol use: No  . Drug use: No  . Sexual activity: Not on file

## 2018-05-25 ENCOUNTER — Ambulatory Visit: Payer: Self-pay | Admitting: Orthopaedic Surgery

## 2020-07-03 ENCOUNTER — Emergency Department (HOSPITAL_COMMUNITY)
Admission: EM | Admit: 2020-07-03 | Discharge: 2020-07-04 | Disposition: A | Discharge status: Home / Self Care | Payer: Managed Care, Other (non HMO) | Attending: Emergency Medicine | Admitting: Emergency Medicine

## 2020-07-03 ENCOUNTER — Other Ambulatory Visit: Payer: Self-pay

## 2020-07-03 DIAGNOSIS — K5792 Diverticulitis of intestine, part unspecified, without perforation or abscess without bleeding: Secondary | ICD-10-CM | POA: Diagnosis not present

## 2020-07-03 DIAGNOSIS — R1032 Left lower quadrant pain: Secondary | ICD-10-CM | POA: Diagnosis present

## 2020-07-03 DIAGNOSIS — Z87891 Personal history of nicotine dependence: Secondary | ICD-10-CM | POA: Diagnosis not present

## 2020-07-03 LAB — URINALYSIS, ROUTINE W REFLEX MICROSCOPIC
Bacteria, UA: NONE SEEN
Bilirubin Urine: NEGATIVE
Glucose, UA: NEGATIVE mg/dL
Hgb urine dipstick: NEGATIVE
Ketones, ur: NEGATIVE mg/dL
Leukocytes,Ua: NEGATIVE
Nitrite: NEGATIVE
Protein, ur: NEGATIVE mg/dL
Specific Gravity, Urine: 1.018 (ref 1.005–1.030)
pH: 6 (ref 5.0–8.0)

## 2020-07-03 LAB — CBC WITH DIFFERENTIAL/PLATELET
Abs Immature Granulocytes: 0.03 10*3/uL (ref 0.00–0.07)
Basophils Absolute: 0 10*3/uL (ref 0.0–0.1)
Basophils Relative: 0 %
Eosinophils Absolute: 0.2 10*3/uL (ref 0.0–0.5)
Eosinophils Relative: 2 %
HCT: 45.9 % (ref 39.0–52.0)
Hemoglobin: 15.9 g/dL (ref 13.0–17.0)
Immature Granulocytes: 0 %
Lymphocytes Relative: 16 %
Lymphs Abs: 1.7 10*3/uL (ref 0.7–4.0)
MCH: 29.4 pg (ref 26.0–34.0)
MCHC: 34.6 g/dL (ref 30.0–36.0)
MCV: 84.8 fL (ref 80.0–100.0)
Monocytes Absolute: 0.8 10*3/uL (ref 0.1–1.0)
Monocytes Relative: 7 %
Neutro Abs: 7.8 10*3/uL — ABNORMAL HIGH (ref 1.7–7.7)
Neutrophils Relative %: 75 %
Platelets: 252 10*3/uL (ref 150–400)
RBC: 5.41 MIL/uL (ref 4.22–5.81)
RDW: 13.1 % (ref 11.5–15.5)
WBC: 10.5 10*3/uL (ref 4.0–10.5)
nRBC: 0 % (ref 0.0–0.2)

## 2020-07-03 LAB — COMPREHENSIVE METABOLIC PANEL
ALT: 40 U/L (ref 0–44)
AST: 20 U/L (ref 15–41)
Albumin: 4.5 g/dL (ref 3.5–5.0)
Alkaline Phosphatase: 80 U/L (ref 38–126)
Anion gap: 10 (ref 5–15)
BUN: 10 mg/dL (ref 6–20)
CO2: 25 mmol/L (ref 22–32)
Calcium: 9.4 mg/dL (ref 8.9–10.3)
Chloride: 103 mmol/L (ref 98–111)
Creatinine, Ser: 1.02 mg/dL (ref 0.61–1.24)
GFR, Estimated: >60 mL/min (ref >60)
Glucose, Bld: 102 mg/dL — ABNORMAL HIGH (ref 70–99)
Potassium: 3.2 mmol/L — ABNORMAL LOW (ref 3.5–5.1)
Sodium: 138 mmol/L (ref 135–145)
Total Bilirubin: 3.5 mg/dL — ABNORMAL HIGH (ref 0.3–1.2)
Total Protein: 7.3 g/dL (ref 6.5–8.1)

## 2020-07-03 LAB — LIPASE, BLOOD: Lipase: 29 U/L (ref 11–51)

## 2020-07-03 MED ORDER — MORPHINE SULFATE (PF) 4 MG/ML IV SOLN
4.0000 mg | Freq: Once | INTRAVENOUS | Status: AC
  Administered 2020-07-03: 4 mg via INTRAVENOUS
  Filled 2020-07-03: qty 1

## 2020-07-03 NOTE — ED Triage Notes (Signed)
Pt c/o abdominal pain that differs from usual diverticulitis dx & after going to family doctor, worried for rupture. On augmentin for abscessed tooth, PCP concerned, requested pt come to ED for imaging & tx.

## 2020-07-03 NOTE — ED Provider Notes (Signed)
[facility] EMERGENCY DEPARTMENT Provider Note   CSN: [id number] Arrival date & time: 07/03/20  26-May-2002     History Chief Complaint  Patient presents with   Diverticulitis inflammation    Shaun Boyer is a 45 y.o. male.  The history is provided by the patient and medical records. No language interpreter was used.  Abdominal Pain Pain location:  LLQ Pain quality: aching   Pain radiates to:  L flank Pain severity:  Severe Onset quality:  Gradual Duration:  1 day Timing:  Constant Progression:  Unchanged Chronicity:  New Context: recent illness (recent dental infection)   Context: not trauma   Relieved by:  Nothing Worsened by:  Palpation Associated symptoms: no chest pain, no chills, no constipation, no cough, no diarrhea, no dysuria, no fatigue, no fever, no nausea, no shortness of breath and no vomiting       Past Medical History:  Diagnosis Date   Diarrhea    predominant irritable syndrome   GERD (gastroesophageal reflux disease)    Gilbert's disease    Obstructive sleep apnea    on CPAP   Spleen enlarged     Patient Active Problem List   Diagnosis Date Noted   Tobacco use 02/16/2018   Chest pain 07/07/2010    Past Surgical History:  Procedure Laterality Date   CHOLECYSTECTOMY  05-25-05   WISDOM TOOTH EXTRACTION         Family History  Problem Relation Age of Onset   Heart attack Father 87       multiple stents,passed away in May 25, 2008 with multiple complications   Multiple sclerosis Father    Coronary artery disease Maternal Grandmother        bypass surgery times 5 vessel   Coronary artery disease Maternal Uncle        with stents and bypass   Heart attack Paternal Grandmother     Social History   Tobacco Use   Smoking status: Former    Packs/day: 2.00    Years: 10.00    Pack years: 20.00    Types: Cigarettes    Quit date: 01/19/2008    Years since quitting: 12.4   Smokeless tobacco: Current    Types: Snuff   Tobacco  comments:    dip 1.5 cans snuff/day  Substance Use Topics   Alcohol use: No   Drug use: No    Home Medications Prior to Admission medications   Medication Sig Start Date End Date Taking? Authorizing Provider  clonazePAM (KLONOPIN) 0.5 MG tablet Take 1 tablet by mouth 3 (three) times daily as needed.  06/18/10   [provider]  naproxen (NAPROSYN) 500 MG tablet  01/02/18   [provider]  nitroGLYCERIN (NITROSTAT) 0.4 MG SL tablet Place 1 tablet (0.4 mg total) under the tongue every 5 (five) minutes as needed for chest pain. 08/06/11 08/05/12  Serpe, Clide Deutscher, PA-C  pantoprazole (PROTONIX) 40 MG tablet Take 1 tablet by mouth Daily. 07/19/11   [provider]  predniSONE (STERAPRED UNI-PAK 21 TAB) 10 MG (21) TBPK tablet See admin instructions. see package 02/16/18   [provider]  sertraline (ZOLOFT) 50 MG tablet Take 1 tablet by mouth Daily. 06/18/10   [provider]  traMADol (ULTRAM) 50 MG tablet Take 1 tablet (50 mg total) by mouth every 6 (six) hours as needed. 03/09/18   Eldred Manges, MD    Allergies    Patient has no known allergies.  Review of Systems  Review of Systems  Constitutional:  Negative for chills, fatigue and fever.  HENT:  Negative for congestion.   Eyes:  Negative for visual disturbance.  Respiratory:  Negative for cough, chest tightness and shortness of breath.   Cardiovascular:  Negative for chest pain.  Gastrointestinal:  Positive for abdominal pain. Negative for abdominal distention, constipation, diarrhea, nausea and vomiting.  Genitourinary:  Positive for flank pain. Negative for decreased urine volume, dysuria, frequency, genital sores, penile pain, testicular pain and urgency.  Musculoskeletal:  Negative for back pain (l flank) and neck pain.  Skin:  Negative for rash and wound.  Neurological:  Negative for headaches.  Psychiatric/Behavioral:  Negative for agitation.   All other systems reviewed and are  negative.  Physical Exam Updated Vital Signs BP (!) 153/107   Pulse (!) 103   Temp 98.2 F (36.8 C) (Oral)   Resp 16   SpO2 98%   Physical Exam Vitals and nursing note reviewed.  Constitutional:      General: He is not in acute distress.    Appearance: He is well-developed. He is not ill-appearing, toxic-appearing or diaphoretic.  HENT:     Head: Normocephalic and atraumatic.  Eyes:     Conjunctiva/sclera: Conjunctivae normal.  Cardiovascular:     Rate and Rhythm: Normal rate and regular rhythm.     Heart sounds: No murmur heard. Pulmonary:     Effort: Pulmonary effort is normal. No respiratory distress.     Breath sounds: Normal breath sounds. No wheezing, rhonchi or rales.  Chest:     Chest wall: No tenderness.  Abdominal:     General: Abdomen is flat.     Palpations: Abdomen is soft.     Tenderness: There is abdominal tenderness. There is no right CVA tenderness, left CVA tenderness, guarding or rebound.  Genitourinary:    Comments: Deferred by patient, no inguinal hernia palpated initially.  Musculoskeletal:        General: Tenderness present.     Cervical back: Neck supple.     Right lower leg: No edema.     Left lower leg: No edema.  Skin:    General: Skin is warm and dry.     Capillary Refill: Capillary refill takes less than 2 seconds.     Findings: No erythema.  Neurological:     General: No focal deficit present.     Mental Status: He is alert.  Psychiatric:        Mood and Affect: Mood normal.    ED Results / Procedures / Treatments   Labs (all labs ordered are listed, but only abnormal results are displayed) Labs Reviewed  CBC WITH DIFFERENTIAL/PLATELET - Abnormal; Notable for the following components:      Result Value   Neutro Abs 7.8 (*)    All other components within normal limits  COMPREHENSIVE METABOLIC PANEL - Abnormal; Notable for the following components:   Potassium 3.2 (*)    Glucose, Bld 102 (*)    Total Bilirubin 3.5 (*)    All  other components within normal limits  URINE CULTURE  LIPASE, BLOOD  URINALYSIS, ROUTINE W REFLEX MICROSCOPIC    EKG None  Radiology CT ABDOMEN PELVIS W CONTRAST  Result Date: 07/04/2020 CLINICAL DATA:  Abdominal pain with history of diverticulitis EXAM: CT ABDOMEN AND PELVIS WITH CONTRAST TECHNIQUE: Multidetector CT imaging of the abdomen and pelvis was performed using the standard protocol following bolus administration of intravenous contrast. CONTRAST:  OMNIPAQUE IOHEXOL 300 MG/ML  SOLN  COMPARISON:  07/13/2019 FINDINGS: LOWER CHEST: Normal. HEPATOBILIARY: Normal hepatic contours. No intra- or extrahepatic biliary dilatation. Status post cholecystectomy. PANCREAS: Normal pancreas. No ductal dilatation or peripancreatic fluid collection. SPLEEN: Normal. ADRENALS/URINARY TRACT: The adrenal glands are normal. No hydronephrosis, nephroureterolithiasis or solid renal mass. The urinary bladder is normal for degree of distention STOMACH/BOWEL: There is no hiatal hernia. Normal duodenal course and caliber. No small bowel dilatation or inflammation. Sigmoid diverticulosis with acute inflammation. No free intraperitoneal air or fluid collection. Normal appendix. VASCULAR/LYMPHATIC: Normal course and caliber of the major abdominal vessels. No abdominal or pelvic lymphadenopathy. REPRODUCTIVE: There are calcifications within the normal-sized prostate. Symmetric seminal vesicles. MUSCULOSKELETAL. No bony spinal canal stenosis or focal osseous abnormality. OTHER: None. IMPRESSION: Acute sigmoid diverticulitis without abscess or free intraperitoneal air. Electronically Signed   By: Deatra Robinson M.D.   On: 07/04/2020 00:51    Procedures Procedures   Medications Ordered in ED Medications  morphine 4 MG/ML injection 4 mg (has no administration in time range)    ED Course  I have reviewed the triage vital signs and the nursing notes.  Pertinent labs & imaging results that were available during my  care of the patient were reviewed by me and considered in my medical decision making (see chart for details).    MDM Rules/Calculators/A&P                          Shaun Boyer is a 45 y.o. male with a past medical history significant for Gilbert's disease, prior cholecystectomy, GERD, irritable bowel syndrome, diverticulitis, and current on antibiotics for dental infection who presents with left lower quadrant abdominal pain today.  Patient spoke to his PCP today who told him to come in for evaluation and rule out a perforated diverticulitis.  According to patient, he has been on antibiotics for around 7 days for dental infection.  He does not report that his worsen or any new symptoms without.  Denies fevers or chills but reports he has had left lower quadrant abdominal pain starting today that is up to 10 out of 10 in severity.  It is now a 7 out of 10.  He reports no nausea or vomiting and reports has had 4 normal bowel movements today without any bleeding constipation or diarrhea.  Denies any urinary changes and denies any history of kidney stones.  Reports the pain does go from the left lower quadrant around towards his left flank and towards his left inguinal area.  Denies any scrotal or testicle pain.  Denies any trauma.  Denies any rashes.  Denies any other complaints.  On exam, lungs are clear and chest is nontender.  Abdomen is tender in the left lower quadrant.  Left CVA area nontender the left flank is tender.  Normal bowel sounds.  Exam otherwise unremarkable.  Patient was seen in triage and ha screening labs and a CT abdomen pelvis ordered.  I do agree with a CT on pelvis with contrast to look for acute diverticulitiss versus kidney stone versus intermittent inguinal hernia that was not palpated on my exam.  He will remain n.p.o., get some pain medicine, and have the labs.  Anticipate reassessment after workup.   Patient had uncomplicated diverticulitis.  No perforation or  abscess.  Patient reported feeling better with medications.  Spoke with pharmacy who recommended adding Levaquin to his Augmentin and follow-up with PCP.  Patient agrees with plan of care and had no other questions  or concerns.  Patient discharged in good condition with return precautions understood.    Final Clinical Impression(s) / ED Diagnoses Final diagnoses:  Diverticulitis  LLQ abdominal pain    Rx / DC Orders ED Discharge Orders          Ordered    levofloxacin (LEVAQUIN) 750 MG tablet  Daily        07/04/20 0333    oxyCODONE-acetaminophen (PERCOCET/ROXICET) 5-325 MG tablet  Every 4 hours PRN        07/04/20 0333    ondansetron (ZOFRAN) 4 MG tablet  Every 8 hours PRN        07/04/20 0333           Clinical Impression: 1. Diverticulitis   2. LLQ abdominal pain     Disposition: Discharge  Condition: Good  I have discussed the results, Dx and Tx plan with the pt(& family if present). He/she/they expressed understanding and agree(s) with the plan. Discharge instructions discussed at great length. Strict return precautions discussed and pt &/or family have verbalized understanding of the instructions. No further questions at time of discharge.    Discharge Medication List as of 07/04/2020  3:35 AM     START taking these medications   Details  levofloxacin (LEVAQUIN) 750 MG tablet Take 1 tablet (750 mg total) by mouth daily for 7 days., Starting Fri 07/04/2020, Until Fri 07/11/2020, Normal    ondansetron (ZOFRAN) 4 MG tablet Take 1 tablet (4 mg total) by mouth every 8 (eight) hours as needed for nausea or vomiting., Starting Fri 07/04/2020, Normal    oxyCODONE-acetaminophen (PERCOCET/ROXICET) 5-325 MG tablet Take 1 tablet by mouth every 4 (four) hours as needed for severe pain., Starting Fri 07/04/2020, Normal        Follow Up: Estanislado PandySasser, Paul W, MD [address] [place] [id number] [phone]     [facility] EM[facility]GENC[facility]PART[facility] [address] [id number] [place] [id number] [phone]       Delora Gravatt, Canary Brimhristopher J, MD 07/04/20 [phone]

## 2020-07-03 NOTE — ED Provider Notes (Signed)
Emergency Medicine Provider Triage Evaluation Note  Shaun Boyer , a 45 y.o. male  was evaluated in triage.  Pt complains of diverticulitis flare.  Saw PCP today and sent here with concern for perforation.  Hx of diverticulitis.  Has been on augmentin for tooth infection so doctor was concerned since that should have been helping.  Had UA which was normal.  Denies urinary symptoms.    Review of Systems  Positive: Abdominal pain Negative: Blood stool, urinary changes  Physical Exam  BP (!) 153/107   Pulse (!) 103   Temp 98.2 F (36.8 C) (Oral)   Resp 16   SpO2 98%   Gen:   Awake, no distress , appears uncomfortable Resp:  Normal effort  MSK:   Moves extremities without difficulty  Other:  LLQ TTP  Medical Decision Making  Medically screening exam initiated at 10:30 PM.  Appropriate orders placed.  Shaun Boyer was informed that the remainder of the evaluation will be completed by another provider, this initial triage assessment does not replace that evaluation, and the importance of remaining in the ED until their evaluation is complete.  Does have known history of diverticulitis.  Labs and CT ordered.  Made acuity 2, will expedite room assignment.   Shaun Hatchet, PA-C 07/03/20 2235    Shaun Laine, MD 07/03/20 [phone]

## 2020-07-04 ENCOUNTER — Emergency Department (HOSPITAL_COMMUNITY): Payer: Managed Care, Other (non HMO)

## 2020-07-04 MED ORDER — IOHEXOL 300 MG/ML  SOLN
100.0000 mL | Freq: Once | INTRAMUSCULAR | Status: AC | PRN
  Administered 2020-07-04: 100 mL via INTRAVENOUS

## 2020-07-04 MED ORDER — OXYCODONE-ACETAMINOPHEN 5-325 MG PO TABS: 1.0000 | ORAL_TABLET | ORAL | 0 refills | Status: DC | PRN

## 2020-07-04 MED ORDER — ONDANSETRON HCL 4 MG PO TABS: 4.0000 mg | ORAL_TABLET | Freq: Three times a day (TID) | ORAL | 0 refills | Status: DC | PRN

## 2020-07-04 MED ORDER — LEVOFLOXACIN 750 MG PO TABS: 750.0000 mg | ORAL_TABLET | Freq: Every day | ORAL | 0 refills | Status: AC

## 2020-07-04 NOTE — Discharge Instructions (Addendum)
Your history, exam, work-up today confirmed diverticulitis as the cause of her left lower quadrant abdominal pain.  It was uncomplicated without evidence of perforation or abscess.  Please use the pain medicine and nausea medicine to maintain hydration and use the new antibiotic in addition to-year-old antibiotic to help treat it.  I spoke with pharmacy who recommended levofloxacin daily for the next 7 days.  If any symptoms change or worsen acutely, please return to the nearest emergency department.

## 2020-07-05 LAB — URINE CULTURE: Culture: NO GROWTH

## 2021-08-11 ENCOUNTER — Encounter: Payer: Self-pay | Admitting: Nurse Practitioner

## 2021-09-03 ENCOUNTER — Encounter: Payer: Self-pay | Admitting: Nurse Practitioner

## 2021-09-03 ENCOUNTER — Other Ambulatory Visit (INDEPENDENT_AMBULATORY_CARE_PROVIDER_SITE_OTHER): Payer: Managed Care, Other (non HMO)

## 2021-09-03 ENCOUNTER — Ambulatory Visit (INDEPENDENT_AMBULATORY_CARE_PROVIDER_SITE_OTHER): Payer: Managed Care, Other (non HMO) | Admitting: Nurse Practitioner

## 2021-09-03 VITALS — BP 114/86 | HR 85 | Ht 73.0 in | Wt 223.0 lb

## 2021-09-03 DIAGNOSIS — K5732 Diverticulitis of large intestine without perforation or abscess without bleeding: Secondary | ICD-10-CM

## 2021-09-03 DIAGNOSIS — R1032 Left lower quadrant pain: Secondary | ICD-10-CM

## 2021-09-03 LAB — BASIC METABOLIC PANEL
BUN: 14 mg/dL (ref 6–23)
CO2: 26 mEq/L (ref 19–32)
Calcium: 9.4 mg/dL (ref 8.4–10.5)
Chloride: 105 mEq/L (ref 96–112)
Creatinine, Ser: 1.21 mg/dL (ref 0.40–1.50)
GFR: 71.93 mL/min (ref >60.00)
Glucose, Bld: 85 mg/dL (ref 70–99)
Potassium: 3.8 mEq/L (ref 3.5–5.1)
Sodium: 140 mEq/L (ref 135–145)

## 2021-09-03 MED ORDER — CLENPIQ 10-3.5-12 MG-GM -GM/160ML PO SOLN: 1.0000 | ORAL | 0 refills | Status: DC

## 2021-09-03 NOTE — Patient Instructions (Addendum)
_______________________________________________________  If you are age 46 or older, your body mass index should be between 23-30. Your Body mass index is 29.42 kg/m. If this is out of the aforementioned range listed, please consider follow up with your Primary Care Provider.  If you are age 46 or younger, your body mass index should be between 19-25. Your Body mass index is 29.42 kg/m. If this is out of the aformentioned range listed, please consider follow up with your Primary Care Provider.   ________________________________________________________  The [facility] providers would like to encourage you to use [facility] to communicate with providers for non-urgent requests or questions.  Due to long hold times on the telephone, sending your provider a message by [facility] may be a faster and more efficient way to get a response.  Please allow 48 business hours for a response.  Please remember that this is for non-urgent requests.  _______________________________________________________  Dennis Bast have been scheduled for a colonoscopy. Please follow written instructions given to you at your visit today.  Please pick up your prep supplies at the pharmacy within the next 1-3 days. If you use inhalers (even only as needed), please bring them with you on the day of your procedure.  Your provider has requested that you go to the basement level for lab work before leaving today. Press "B" on the elevator. The lab is located at the first door on the left as you exit the elevator.  Due to recent changes in healthcare laws, you may see the results of your imaging and laboratory studies on MyChart before your provider has had a chance to review them.  We understand that in some cases there may be results that are confusing or concerning to you. Not all laboratory results come back in the same time frame and the provider may be waiting for multiple results in order to interpret others.  Please give [place] 48 hours in  order for your provider to thoroughly review all the results before contacting the office for clarification of your results.    You will be contacted by [facility] [place] in the next 2 days to arrange a CT scan.  The number on your caller ID will be [phone], please answer when they call.  If you have not heard from them in 2 days please call [phone] to schedule.    You have been scheduled for a CT scan of the abdomen and pelvis at [facility], 1st floor Radiology. You are scheduled on    at     . You should arrive 15 minutes prior to your appointment time for registration.  We are giving you 2 bottles of contrast today that you will need to drink before arriving for the exam. The solution may taste better if refrigerated so put them in the refrigerator when you get home, but do NOT add ice or any other liquid to this solution as that would dilute it. Shake well before drinking.   Please follow the written instructions below on the day of your exam:   1) Do not eat anything after      (4 hours prior to your test)   2) Drink 1 bottle of contrast @      (2 hours prior to your exam)  Remember to shake well before drinking and do NOT pour over ice.     Drink 1 bottle of contrast @        (1 hour prior to your exam)  You may take any medications as prescribed with a small amount of water, if necessary. If you take any of the following medications: METFORMIN, GLUCOPHAGE, GLUCOVANCE, AVANDAMET, RIOMET, FORTAMET, [place] MET, JANUMET, GLUMETZA or METAGLIP, you MAY be asked to HOLD this medication 48 hours AFTER the exam.   The purpose of you drinking the oral contrast is to aid in the visualization of your intestinal tract. The contrast solution may cause some diarrhea. Depending on your individual set of symptoms, you may also receive an intravenous injection of x-ray contrast/dye. Plan on being at [facility] for 45 minutes or longer, depending on the type of exam you are  having performed.   If you have any questions regarding your exam or if you need to reschedule, you may call [facility] at [phone] between the hours of 8:00 am and 5:00 pm, Monday-Friday.      It was a pleasure to see you today!  Thank you for trusting me with your gastrointestinal care!

## 2021-09-03 NOTE — Progress Notes (Signed)
Chief Complaint:  diverticulitis   Assessment & Plan   # 46 yo male with a history of sigmoid diverticulitis presenting with persistent LLQ pain / tenderness. Pain resolves with antibiotics but recurs within a short period of time off antibiotics.  He has recently had at least 2 courses of antibiotics.  Ongoing diverticulitis? IBS related?  He needs a colonoscopy to exclude colon neoplasm. However, we wouldn't want to perform a colonoscopy in setting of acute diverticulitis as this would increase risk of perforation.   Will obtain CT scan. If no active inflammation will proceed with colonoscopy.  The risks and benefits of colonoscopy with possible polypectomy / biopsies were discussed and the patient agrees to proceed.    HPI:    Shaun Boyer is a 46 y.o. year old male with a past medical history of GERD, IBS with chronic diarrhea , Gilbert's, OSA on CPAP, diverticulitis, cholecystectomy , splenomegaly  Shaun Boyer is referred by his PCP for diverticulitis. He is here with his wife. He has been having episodic of LLQ pain for 3 years.  CT scan in June 2021 showed acute uncomplicated sigmoid diverticulitis. He has since had severe more episodes of the same pain. Pain does respond to antibiotics. He had a repeat CT scan a year ago,also showing uncomplicated sigmoid diverticulitis. Recently he has required several rounds of antibiotics. He last took 10 days of Augmentin prescribed on 8/1. Pain resolved but returned within a day.   Interval history: The near constant LLQ pain radiates around to his back. It is always the same pain , it is not related to eating or bowel movements.  No fevers. No urinary symptoms.  He gives a history of IBS with chronic diarrhea but says that he doesn't generally have pain with IBS. His bowel movements are unchanged.  He occasionally see blood on tissue but attributes that to perianal irration   Previous Labs / Imaging::    Latest Ref Rng & Units 07/03/2020   10:35  PM  CBC  WBC 4.0 - 10.5 K/uL 10.5   Hemoglobin 13.0 - 17.0 g/dL 15.9   Hematocrit 39.0 - 52.0 % 45.9   Platelets 150 - 400 K/uL 252     Lab Results  Component Value Date   LIPASE 29 07/03/2020      Latest Ref Rng & Units 07/03/2020   10:35 PM  CMP  Glucose 70 - 99 mg/dL 102   BUN 6 - 20 mg/dL 10   Creatinine 0.61 - 1.24 mg/dL 1.02   Sodium 135 - 145 mmol/L 138   Potassium 3.5 - 5.1 mmol/L 3.2   Chloride 98 - 111 mmol/L 103   CO2 22 - 32 mmol/L 25   Calcium 8.9 - 10.3 mg/dL 9.4   Total Protein 6.5 - 8.1 g/dL 7.3   Total Bilirubin 0.3 - 1.2 mg/dL 3.5   Alkaline Phos 38 - 126 U/L 80   AST 15 - 41 U/L 20   ALT 0 - 44 U/L 40      Imaging:  CT ABDOMEN PELVIS W CONTRAST CLINICAL DATA:  Abdominal pain with history of diverticulitis  EXAM: CT ABDOMEN AND PELVIS WITH CONTRAST  TECHNIQUE: Multidetector CT imaging of the abdomen and pelvis was performed using the standard protocol following bolus administration of intravenous contrast.  CONTRAST:  178mL OMNIPAQUE IOHEXOL 300 MG/ML  SOLN  COMPARISON:  07/13/2019  FINDINGS: LOWER CHEST: Normal.  HEPATOBILIARY: Normal hepatic contours. No intra- or extrahepatic biliary dilatation. Status post cholecystectomy.  PANCREAS: Normal pancreas. No ductal dilatation or peripancreatic fluid collection.  SPLEEN: Normal.  ADRENALS/URINARY TRACT: The adrenal glands are normal. No hydronephrosis, nephroureterolithiasis or solid renal mass. The urinary bladder is normal for degree of distention  STOMACH/BOWEL: There is no hiatal hernia. Normal duodenal course and caliber. No small bowel dilatation or inflammation. Sigmoid diverticulosis with acute inflammation. No free intraperitoneal air or fluid collection. Normal appendix.  VASCULAR/LYMPHATIC: Normal course and caliber of the major abdominal vessels. No abdominal or pelvic lymphadenopathy.  REPRODUCTIVE: There are calcifications within the normal-sized prostate. Symmetric  seminal vesicles.  MUSCULOSKELETAL. No bony spinal canal stenosis or focal osseous abnormality.  OTHER: None.  IMPRESSION: Acute sigmoid diverticulitis without abscess or free intraperitoneal air.  Electronically Signed   By: Deatra Robinson M.D.   On: 07/04/2020 00:51    Past Medical History:  Diagnosis Date   Diarrhea    predominant irritable syndrome   GERD (gastroesophageal reflux disease)    Gilbert's disease    Obstructive sleep apnea    on CPAP   Spleen enlarged    Past Surgical History:  Procedure Laterality Date   CHOLECYSTECTOMY  2005/05/10   WISDOM TOOTH EXTRACTION     Family History  Problem Relation Age of Onset   Heart attack Father 1       multiple stents,passed away in 05/10/08 with multiple complications   Multiple sclerosis Father    Coronary artery disease Maternal Grandmother        bypass surgery times 5 vessel   Coronary artery disease Maternal Uncle        with stents and bypass   Heart attack Paternal Grandmother    Social History   Tobacco Use   Smoking status: Former    Packs/day: 2.00    Years: 10.00    Total pack years: 20.00    Types: Cigarettes    Quit date: 01/19/2008    Years since quitting: 13.6   Smokeless tobacco: Current    Types: Snuff   Tobacco comments:    dip 1.5 cans snuff/day  Substance Use Topics   Alcohol use: No   Drug use: No   Current Outpatient Medications  Medication Sig Dispense Refill   loratadine (CLARITIN) 10 MG tablet Take 10 mg by mouth daily.     omeprazole (PRILOSEC) 40 MG capsule Take 40 mg by mouth daily.     ondansetron (ZOFRAN) 4 MG tablet Take 1 tablet (4 mg total) by mouth every 8 (eight) hours as needed for nausea or vomiting. 12 tablet 0   propranolol ER (INDERAL LA) 60 MG 24 hr capsule Take 60 mg by mouth daily.     clonazePAM (KLONOPIN) 0.5 MG tablet Take 1 tablet by mouth 3 (three) times daily as needed.  (Patient not taking: Reported on 09/03/2021)     naproxen (NAPROSYN) 500 MG tablet  (Patient  not taking: Reported on 09/03/2021)     oxyCODONE-acetaminophen (PERCOCET/ROXICET) 5-325 MG tablet Take 1 tablet by mouth every 4 (four) hours as needed for severe pain. (Patient not taking: Reported on 09/03/2021) 15 tablet 0   sertraline (ZOLOFT) 50 MG tablet Take 1 tablet by mouth Daily. (Patient not taking: Reported on 09/03/2021)     traMADol (ULTRAM) 50 MG tablet Take 1 tablet (50 mg total) by mouth every 6 (six) hours as needed. (Patient not taking: Reported on 09/03/2021) 30 tablet 0   No current facility-administered medications for this visit.   No Known Allergies   Review of Systems:  All  systems reviewed and negative except where noted in HPI.   Wt Readings from Last 3 Encounters:  04/06/18 230 lb (104.3 kg)  03/23/18 230 lb (104.3 kg)  03/09/18 230 lb (104.3 kg)    Physical Exam   BP 114/86   Pulse 85   Ht 6\' 1"  (1.854 m)   Wt 223 lb (101.2 kg)   BMI 29.42 kg/m  Constitutional:  Generally well appearing male in no acute distress. Psychiatric: Pleasant. Normal mood and affect. Behavior is normal. EENT: Pupils normal.  Conjunctivae are normal. No scleral icterus. Neck supple.  Cardiovascular: Normal rate, regular rhythm. No edema Pulmonary/chest: Effort normal and breath sounds normal. No wheezing, rales or rhonchi. Abdominal: Soft, moderate left mid abdominal tenderness.  Bowel sounds active throughout. There are no masses palpable. No hepatomegaly. Neurological: Alert and oriented to person place and time. Skin: Skin is warm and dry. No rashes noted.  , NP  09/03/2021, 11:39 AM  Cc:  Referring Provider 09/05/2021, PA [facility]

## 2021-09-04 ENCOUNTER — Other Ambulatory Visit: Payer: Self-pay | Admitting: Nurse Practitioner

## 2021-09-04 ENCOUNTER — Other Ambulatory Visit: Payer: Self-pay

## 2021-09-04 DIAGNOSIS — K5792 Diverticulitis of intestine, part unspecified, without perforation or abscess without bleeding: Secondary | ICD-10-CM

## 2021-09-05 NOTE — Progress Notes (Signed)
Agree with assessment/plan.  Raj Fouad Taul, MD [facility] [phone]  

## 2021-09-07 ENCOUNTER — Ambulatory Visit
Admission: RE | Admit: 2021-09-07 | Discharge: 2021-09-07 | Disposition: A | Discharge status: Home / Self Care | Payer: Managed Care, Other (non HMO) | Source: Ambulatory Visit | Attending: Nurse Practitioner | Admitting: Nurse Practitioner

## 2021-09-07 ENCOUNTER — Other Ambulatory Visit: Payer: Managed Care, Other (non HMO)

## 2021-09-07 DIAGNOSIS — K5792 Diverticulitis of intestine, part unspecified, without perforation or abscess without bleeding: Secondary | ICD-10-CM

## 2021-09-07 MED ORDER — IOPAMIDOL (ISOVUE-300) INJECTION 61%
100.0000 mL | Freq: Once | INTRAVENOUS | Status: AC | PRN
  Administered 2021-09-07: 100 mL via INTRAVENOUS

## 2021-09-08 ENCOUNTER — Ambulatory Visit (HOSPITAL_COMMUNITY): Payer: Managed Care, Other (non HMO)

## 2021-09-09 ENCOUNTER — Telehealth: Payer: Self-pay

## 2021-09-09 ENCOUNTER — Other Ambulatory Visit: Payer: Self-pay

## 2021-09-09 DIAGNOSIS — R1032 Left lower quadrant pain: Secondary | ICD-10-CM

## 2021-09-09 NOTE — Telephone Encounter (Signed)
Spoke with pt and gave pt results and recommendations. Pt stated he felt like he couldn't wait until 9/27 to have colonoscopy because of LLQ abdominal pain he has been having. Colonoscopy rescheduled to 09/15/21 at 11 am with Dr Chales Abrahams. Updated instructions sent to pt's mychart and ambulatory referral placed. Pt verbalized understanding and stated he already picked up the prep from his pharmacy.

## 2021-09-09 NOTE — Telephone Encounter (Signed)
-----   Message from Meredith Pel, NP sent at 09/09/2021 12:37 PM EDT ----- Shaun Boyer,  I accidentally clicked something on patient's CT scan and now I can't write in the results part. Please call him. No obvious diverticulitis but there appears to be some inflammation in his colon of unclear cause. Will know more after colonoscopy . Thanks

## 2021-09-15 ENCOUNTER — Encounter: Payer: Self-pay | Admitting: Gastroenterology

## 2021-09-15 ENCOUNTER — Ambulatory Visit: Payer: Managed Care, Other (non HMO) | Admitting: Gastroenterology

## 2021-09-15 VITALS — BP 116/80 | HR 74 | Temp 96.9°F | Resp 13 | Ht 73.0 in | Wt 223.0 lb

## 2021-09-15 DIAGNOSIS — K5732 Diverticulitis of large intestine without perforation or abscess without bleeding: Secondary | ICD-10-CM | POA: Diagnosis not present

## 2021-09-15 DIAGNOSIS — K640 First degree hemorrhoids: Secondary | ICD-10-CM | POA: Diagnosis not present

## 2021-09-15 DIAGNOSIS — K529 Noninfective gastroenteritis and colitis, unspecified: Secondary | ICD-10-CM

## 2021-09-15 DIAGNOSIS — Z0389 Encounter for observation for other suspected diseases and conditions ruled out: Secondary | ICD-10-CM

## 2021-09-15 MED ORDER — SODIUM CHLORIDE 0.9 % IV SOLN: 500.0000 mL | Freq: Once | INTRAVENOUS | Status: DC

## 2021-09-15 NOTE — Patient Instructions (Signed)
HANDOUTS ON DIVERTICULOSIS AND HEMORRHOIDS GIVEN.   YOU HAD AN ENDOSCOPIC PROCEDURE TODAY AT THE [facility]:   Refer to the procedure report that was given to you for any specific questions about what was found during the examination.  If the procedure report does not answer your questions, please call your gastroenterologist to clarify.  If you requested that your care partner not be given the details of your procedure findings, then the procedure report has been included in a sealed envelope for you to review at your convenience later.  YOU SHOULD EXPECT: Some feelings of bloating in the abdomen. Passage of more gas than usual.  Walking can help get rid of the air that was put into your GI tract during the procedure and reduce the bloating. If you had a lower endoscopy (such as a colonoscopy or flexible sigmoidoscopy) you may notice spotting of blood in your stool or on the toilet paper. If you underwent a bowel prep for your procedure, you may not have a normal bowel movement for a few days.  Please Note:  You might notice some irritation and congestion in your nose or some drainage.  This is from the oxygen used during your procedure.  There is no need for concern and it should clear up in a day or so.  SYMPTOMS TO REPORT IMMEDIATELY:  Following lower endoscopy (colonoscopy or flexible sigmoidoscopy):  Excessive amounts of blood in the stool  Significant tenderness or worsening of abdominal pains  Swelling of the abdomen that is new, acute  Fever of 100F or higher    For urgent or emergent issues, a gastroenterologist can be reached at any hour by calling [phone]. Do not use MyChart messaging for urgent concerns.    DIET:  We do recommend a small meal at first, but then you may proceed to your regular diet.  Drink plenty of fluids but you should avoid alcoholic beverages for 24 hours.  ACTIVITY:  You should plan to take it easy for the rest of today and you should NOT  DRIVE or use heavy machinery until tomorrow (because of the sedation medicines used during the test).    FOLLOW UP: Our staff will call the number listed on your records the next business day following your procedure.  We will call around 7:15- 8:00 am to check on you and address any questions or concerns that you may have regarding the information given to you following your procedure. If we do not reach you, we will leave a message.  If you develop any symptoms (ie: fever, flu-like symptoms, shortness of breath, cough etc.) before then, please call [phone].  If you test positive for Covid 19 in the 2 weeks post procedure, please call and report this information to [place].    If any biopsies were taken you will be contacted by phone or by letter within the next 1-3 weeks.  Please call us at [phone] if you have not heard about the biopsies in 3 weeks.    SIGNATURES/CONFIDENTIALITY: You and/or your care partner have signed paperwork which will be entered into your electronic medical record.  These signatures attest to the fact that that the information above on your After Visit Summary has been reviewed and is understood.  Full responsibility of the confidentiality of this discharge information lies with you and/or your care-partner.

## 2021-09-15 NOTE — Progress Notes (Signed)
Called to room to assist during endoscopic procedure.  Patient ID and intended procedure confirmed with present staff. Received instructions for my participation in the procedure from the performing physician.  

## 2021-09-15 NOTE — Progress Notes (Signed)
PT taken to PACU. Monitors in place. VSS. Report given to RN. 

## 2021-09-15 NOTE — Op Note (Signed)
[facility] Patient Name: Shaun Boyer Procedure Date: 09/15/2021 10:55 AM MRN: [id number] Endoscopist: Lynann Bologna , MD Age: 46 Referring MD:  Date of Birth: 02-04-1975 Gender: Male Account #: [id number] Procedure:                Colonoscopy Indications:              Clinically significant diarrhea of unexplained                            origin/ H/O recurrent diverticulitis. Medicines:                Monitored Anesthesia Care Procedure:                Pre-Anesthesia Assessment:                           - Prior to the procedure, a History and Physical                            was performed, and patient medications and                            allergies were reviewed. The patient's tolerance of                            previous anesthesia was also reviewed. The risks                            and benefits of the procedure and the sedation                            options and risks were discussed with the patient.                            All questions were answered, and informed consent                            was obtained. Prior Anticoagulants: The patient has                            taken no previous anticoagulant or antiplatelet                            agents. ASA Grade Assessment: II - A patient with                            mild systemic disease. After reviewing the risks                            and benefits, the patient was deemed in                            satisfactory condition to undergo the procedure.  After obtaining informed consent, the colonoscope                            was passed under direct vision. Throughout the                            procedure, the patient's blood pressure, pulse, and                            oxygen saturations were monitored continuously. The                            CF [id number] #[id number] was introduced through the anus                            and advanced to the 4 cm into  the ileum. The                            colonoscopy was performed without difficulty. The                            patient tolerated the procedure well. The quality                            of the bowel preparation was good. The terminal                            ileum, ileocecal valve, appendiceal orifice, and                            rectum were photographed. Scope In: 11:06:36 AM Scope Out: 11:21:56 AM Scope Withdrawal Time: 0 hours 11 minutes 41 seconds  Total Procedure Duration: 0 hours 15 minutes 20 seconds  Findings:                 The colon (entire examined portion) appeared                            normal. Biopsies for histology were taken with a                            cold forceps from the entire colon for evaluation                            of microscopic colitis.                           Multiple medium-mouthed diverticula were found in                            the sigmoid colon and few in descending colon.                           The terminal ileum appeared normal. Biopsies were  taken with a cold forceps for histology.                           Non-bleeding internal hemorrhoids were found during                            retroflexion. The hemorrhoids were small and Grade                            I (internal hemorrhoids that do not prolapse).                           The exam was otherwise without abnormality on                            direct and retroflexion views. Complications:            No immediate complications. Estimated Blood Loss:     Estimated blood loss: none. Impression:               - Moderate predominantly sigmoid diverticulosis.                           - Non-bleeding internal hemorrhoids.                           - Otherwise normal colonoscopy to TI. Recommendation:           - Patient has a contact number available for                            emergencies. The signs and symptoms of potential                             delayed complications were discussed with the                            patient. Return to normal activities tomorrow.                            Written discharge instructions were provided to the                            patient.                           - Resume previous diet.                           - Continue present medications.                           - Await pathology results.                           - Repeat colonoscopy in 10 years for screening  purposes. Earlier, if with any new problems or                            change in family history.                           - The findings and recommendations were discussed                            with the patient's family. Lynann Bologna, MD 09/15/2021 11:33:02 AM This report has been signed electronically.

## 2021-09-15 NOTE — Progress Notes (Signed)
Chief Complaint:  diverticulitis     Assessment & Plan    # 46 yo male with a history of sigmoid diverticulitis presenting with persistent LLQ pain / tenderness. Pain resolves with antibiotics but recurs within a short period of time off antibiotics.  He has recently had at least 2 courses of antibiotics.  Ongoing diverticulitis? IBS related?  He needs a colonoscopy to exclude colon neoplasm. However, we wouldn't want to perform a colonoscopy in setting of acute diverticulitis as this would increase risk of perforation.   Will obtain CT scan. If no active inflammation will proceed with colonoscopy.  The risks and benefits of colonoscopy with possible polypectomy / biopsies were discussed and the patient agrees to proceed.      HPI:     Shaun Boyer is a 46 y.o. year old male with a past medical history of GERD, IBS with chronic diarrhea , Gilbert's, OSA on CPAP, diverticulitis, cholecystectomy , splenomegaly   Shaun Boyer is referred by his PCP for diverticulitis. He is here with his wife. He has been having episodic of LLQ pain for 3 years.  CT scan in June 2021 showed acute uncomplicated sigmoid diverticulitis. He has since had severe more episodes of the same pain. Pain does respond to antibiotics. He had a repeat CT scan a year ago,also showing uncomplicated sigmoid diverticulitis. Recently he has required several rounds of antibiotics. He last took 10 days of Augmentin prescribed on 8/1. Pain resolved but returned within a day.    Interval history: The near constant LLQ pain radiates around to his back. It is always the same pain , it is not related to eating or bowel movements.  No fevers. No urinary symptoms.  He gives a history of IBS with chronic diarrhea but says that he doesn't generally have pain with IBS. His bowel movements are unchanged.  He occasionally see blood on tissue but attributes that to perianal irration     Previous Labs / Imaging::     Latest Ref Rng & Units  07/03/2020   10:35 PM  CBC  WBC 4.0 - 10.5 K/uL 10.5   Hemoglobin 13.0 - 17.0 g/dL 15.9   Hematocrit 39.0 - 52.0 % 45.9   Platelets 150 - 400 K/uL 252       Recent Labs       Lab Results  Component Value Date    LIPASE 29 07/03/2020          Latest Ref Rng & Units 07/03/2020   10:35 PM  CMP  Glucose 70 - 99 mg/dL 102   BUN 6 - 20 mg/dL 10   Creatinine 0.61 - 1.24 mg/dL 1.02   Sodium 135 - 145 mmol/L 138   Potassium 3.5 - 5.1 mmol/L 3.2   Chloride 98 - 111 mmol/L 103   CO2 22 - 32 mmol/L 25   Calcium 8.9 - 10.3 mg/dL 9.4   Total Protein 6.5 - 8.1 g/dL 7.3   Total Bilirubin 0.3 - 1.2 mg/dL 3.5   Alkaline Phos 38 - 126 U/L 80   AST 15 - 41 U/L 20   ALT 0 - 44 U/L 40         Imaging:  CT ABDOMEN PELVIS W CONTRAST CLINICAL DATA:  Abdominal pain with history of diverticulitis   EXAM: CT ABDOMEN AND PELVIS WITH CONTRAST   TECHNIQUE: Multidetector CT imaging of the abdomen and pelvis was performed using the standard protocol following bolus administration of intravenous contrast.  CONTRAST:  OMNIPAQUE IOHEXOL 300 MG/ML  SOLN   COMPARISON:  07/13/2019   FINDINGS: LOWER CHEST: Normal.   HEPATOBILIARY: Normal hepatic contours. No intra- or extrahepatic biliary dilatation. Status post cholecystectomy.   PANCREAS: Normal pancreas. No ductal dilatation or peripancreatic fluid collection.   SPLEEN: Normal.   ADRENALS/URINARY TRACT: The adrenal glands are normal. No hydronephrosis, nephroureterolithiasis or solid renal mass. The urinary bladder is normal for degree of distention   STOMACH/BOWEL: There is no hiatal hernia. Normal duodenal course and caliber. No small bowel dilatation or inflammation. Sigmoid diverticulosis with acute inflammation. No free intraperitoneal air or fluid collection. Normal appendix.   VASCULAR/LYMPHATIC: Normal course and caliber of the major abdominal vessels. No abdominal or pelvic lymphadenopathy.   REPRODUCTIVE: There  are calcifications within the normal-sized prostate. Symmetric seminal vesicles.   MUSCULOSKELETAL. No bony spinal canal stenosis or focal osseous abnormality.   OTHER: None.   IMPRESSION: Acute sigmoid diverticulitis without abscess or free intraperitoneal air.   Electronically Signed   By: Deatra Robinson M.D.   On: 07/04/2020 00:51           Past Medical History:  Diagnosis Date   Diarrhea      predominant irritable syndrome   GERD (gastroesophageal reflux disease)     Gilbert's disease     Obstructive sleep apnea      on CPAP   Spleen enlarged           Past Surgical History:  Procedure Laterality Date   CHOLECYSTECTOMY   05/03/2005   WISDOM TOOTH EXTRACTION             Family History  Problem Relation Age of Onset   Heart attack Father 74        multiple stents,passed away in 05-03-2008 with multiple complications   Multiple sclerosis Father     Coronary artery disease Maternal Grandmother          bypass surgery times 5 vessel   Coronary artery disease Maternal Uncle          with stents and bypass   Heart attack Paternal Grandmother      Social History         Tobacco Use   Smoking status: Former      Packs/day: 2.00      Years: 10.00      Total pack years: 20.00      Types: Cigarettes      Quit date: 01/19/2008      Years since quitting: 13.6   Smokeless tobacco: Current      Types: Snuff   Tobacco comments:      dip 1.5 cans snuff/day  Substance Use Topics   Alcohol use: No   Drug use: No          Current Outpatient Medications  Medication Sig Dispense Refill   loratadine (CLARITIN) 10 MG tablet Take 10 mg by mouth daily.       omeprazole (PRILOSEC) 40 MG capsule Take 40 mg by mouth daily.       ondansetron (ZOFRAN) 4 MG tablet Take 1 tablet (4 mg total) by mouth every 8 (eight) hours as needed for nausea or vomiting. 12 tablet 0   propranolol ER (INDERAL LA) 60 MG 24 hr capsule Take 60 mg by mouth daily.       clonazePAM (KLONOPIN) 0.5 MG tablet Take  1 tablet by mouth 3 (three) times daily as needed.  (Patient not taking: Reported on 09/03/2021)  naproxen (NAPROSYN) 500 MG tablet  (Patient not taking: Reported on 09/03/2021)       oxyCODONE-acetaminophen (PERCOCET/ROXICET) 5-325 MG tablet Take 1 tablet by mouth every 4 (four) hours as needed for severe pain. (Patient not taking: Reported on 09/03/2021) 15 tablet 0   sertraline (ZOLOFT) 50 MG tablet Take 1 tablet by mouth Daily. (Patient not taking: Reported on 09/03/2021)       traMADol (ULTRAM) 50 MG tablet Take 1 tablet (50 mg total) by mouth every 6 (six) hours as needed. (Patient not taking: Reported on 09/03/2021) 30 tablet 0    No current facility-administered medications for this visit.    No Known Allergies     Review of Systems:  All systems reviewed and negative except where noted in HPI.       Wt Readings from Last 3 Encounters:  04/06/18 230 lb (104.3 kg)  03/23/18 230 lb (104.3 kg)  03/09/18 230 lb (104.3 kg)      Physical Exam    BP 114/86   Pulse 85   Ht 6\' 1"  (1.854 m)   Wt 223 lb (101.2 kg)   BMI 29.42 kg/m  Constitutional:  Generally well appearing male in no acute distress. Psychiatric: Pleasant. Normal mood and affect. Behavior is normal. EENT: Pupils normal.  Conjunctivae are normal. No scleral icterus. Neck supple.  Cardiovascular: Normal rate, regular rhythm. No edema Pulmonary/chest: Effort normal and breath sounds normal. No wheezing, rales or rhonchi. Abdominal: Soft, moderate left mid abdominal tenderness.  Bowel sounds active throughout. There are no masses palpable. No hepatomegaly. Neurological: Alert and oriented to person place and time. Skin: Skin is warm and dry. No rashes noted.   , NP    Attending physician's note   I have taken history, reviewed the chart and examined the patient. I performed a substantive portion of this encounter, including complete performance of at least one of the key components, in conjunction  with the APP. I agree with the Advanced Practitioner's note, impression and recommendations.   Diverticulitis has resolved. IBS-D   Willette Cluster, MD [facility] [phone]

## 2021-09-16 ENCOUNTER — Telehealth: Payer: Self-pay

## 2021-09-16 NOTE — Telephone Encounter (Signed)
  Follow up Call-     09/15/2021   10:27 AM  Call back number  Post procedure Call Back phone  # [phone]  Permission to leave phone message Yes     Patient questions:  Do you have a fever, pain , or abdominal swelling? No. Pain Score  0 *  Have you tolerated food without any problems? Yes.    Have you been able to return to your normal activities? Yes.    Do you have any questions about your discharge instructions: Diet   No. Medications  No. Follow up visit  No.  Do you have questions or concerns about your Care? No.  Actions: * If pain score is 4 or above: No action needed, pain <4.

## 2021-09-17 ENCOUNTER — Telehealth: Payer: Self-pay | Admitting: Nurse Practitioner

## 2021-09-17 ENCOUNTER — Other Ambulatory Visit: Payer: Self-pay

## 2021-09-17 MED ORDER — DICYCLOMINE HCL 10 MG PO CAPS: 10.0000 mg | ORAL_CAPSULE | Freq: Three times a day (TID) | ORAL | 0 refills | Status: DC

## 2021-09-17 NOTE — Telephone Encounter (Signed)
Received verbal order from Dr. Chales Abrahams via secure chat: "Expected.  He has history of IBS and had diarrhea before as well.  Please put him on Bentyl 10 mg p.o. 4 times daily x 2 weeks." Prescription has been sent to pharmacy. Pt notified of recommendations & advised to call back if symptoms do not improve.

## 2021-09-17 NOTE — Telephone Encounter (Signed)
Patient called in with complaints of constant, cramping, generalized abdominal pain that radiates to the left side into his back. He had a normal bowel movement this morning when he first woke up & then went to work and had diarrhea with the onset of pain. He went home & had taken an old prescription of tramadol which relieved his pain some. He's still able to hold down food/drink. Colonoscopy was performed on 09/15/21 with Dr. Chales Abrahams. Will route to MD for further recommendations.

## 2021-09-17 NOTE — Telephone Encounter (Signed)
PT had colonoscopy 8/29 and today he is experiencing knots in his stomach, its rolling and aching to his back. He is seeking some type of relief. Please advise.

## 2021-09-19 ENCOUNTER — Encounter: Payer: Self-pay | Admitting: Gastroenterology

## 2021-09-28 ENCOUNTER — Other Ambulatory Visit (INDEPENDENT_AMBULATORY_CARE_PROVIDER_SITE_OTHER): Payer: Managed Care, Other (non HMO)

## 2021-09-28 ENCOUNTER — Other Ambulatory Visit: Payer: Self-pay

## 2021-09-28 ENCOUNTER — Telehealth: Payer: Self-pay | Admitting: Gastroenterology

## 2021-09-28 DIAGNOSIS — R197 Diarrhea, unspecified: Secondary | ICD-10-CM | POA: Diagnosis not present

## 2021-09-28 DIAGNOSIS — R109 Unspecified abdominal pain: Secondary | ICD-10-CM

## 2021-09-28 LAB — CBC WITH DIFFERENTIAL/PLATELET
Basophils Absolute: 0 10*3/uL (ref 0.0–0.1)
Basophils Relative: 0.5 % (ref 0.0–3.0)
Eosinophils Absolute: 0.1 10*3/uL (ref 0.0–0.7)
Eosinophils Relative: 2.4 % (ref 0.0–5.0)
HCT: 46.1 % (ref 39.0–52.0)
Hemoglobin: 16 g/dL (ref 13.0–17.0)
Lymphocytes Relative: 25.4 % (ref 12.0–46.0)
Lymphs Abs: 1.4 10*3/uL (ref 0.7–4.0)
MCHC: 34.6 g/dL (ref 30.0–36.0)
MCV: 84.9 fl (ref 78.0–100.0)
Monocytes Absolute: 0.5 10*3/uL (ref 0.1–1.0)
Monocytes Relative: 8.5 % (ref 3.0–12.0)
Neutro Abs: 3.5 10*3/uL (ref 1.4–7.7)
Neutrophils Relative %: 63.2 % (ref 43.0–77.0)
Platelets: 224 10*3/uL (ref 150.0–400.0)
RBC: 5.43 Mil/uL (ref 4.22–5.81)
RDW: 12.8 % (ref 11.5–15.5)
WBC: 5.5 10*3/uL (ref 4.0–10.5)

## 2021-09-28 LAB — COMPREHENSIVE METABOLIC PANEL
ALT: 28 U/L (ref 0–53)
AST: 17 U/L (ref 0–37)
Albumin: 4.6 g/dL (ref 3.5–5.2)
Alkaline Phosphatase: 65 U/L (ref 39–117)
BUN: 13 mg/dL (ref 6–23)
CO2: 29 mEq/L (ref 19–32)
Calcium: 9.8 mg/dL (ref 8.4–10.5)
Chloride: 103 mEq/L (ref 96–112)
Creatinine, Ser: 1.27 mg/dL (ref 0.40–1.50)
GFR: 67.84 mL/min (ref >60.00)
Glucose, Bld: 91 mg/dL (ref 70–99)
Potassium: 3.5 mEq/L (ref 3.5–5.1)
Sodium: 139 mEq/L (ref 135–145)
Total Bilirubin: 1.9 mg/dL — ABNORMAL HIGH (ref 0.2–1.2)
Total Protein: 7.4 g/dL (ref 6.0–8.3)

## 2021-09-28 LAB — C-REACTIVE PROTEIN: CRP: <1 mg/dL (ref 0.5–20.0)

## 2021-09-28 LAB — TSH: TSH: 0.79 u[IU]/mL (ref 0.35–5.50)

## 2021-09-28 MED ORDER — RIFAXIMIN 550 MG PO TABS: 550.0000 mg | ORAL_TABLET | Freq: Three times a day (TID) | ORAL | 0 refills | Status: AC

## 2021-09-28 NOTE — Telephone Encounter (Signed)
Patient called states he would like to speak to a nurse regarding his symptoms. Please call to advise.

## 2021-09-28 NOTE — Telephone Encounter (Signed)
Dr. Chales Abrahams pt:  Pt stated that he recently had a Colonoscopy on 09/15/2021: Pt stated that he was fine that day and the next day. Two days later pt started having diarrhea along with abdominal pain and was prescribed Bentyl 10 mg p.o. 4 times daily x 2 weeks.: Pt stated that he was feeling better up until today where as he is having abdominal pain and knotting in his stomach  along with multiple loose stools: Pt stated that he cannot get out of the bathroom this AM and has already had 6 liquid BM's Please advise as DOD

## 2021-09-28 NOTE — Telephone Encounter (Signed)
Pt was made aware of Dr. Orvan Falconer recommendations: Orders for labs placed in Epic: Pt made aware: Pt stated that he will come today and have labs drawn: Prescription sent to pharmacy; Pt made aware: Pt scheduled for an office visit with Alcide Evener NP on 10/19/2021 at 1:30: Pt made aware: Pt verbalized understanding with all questions answered and stated that he will come to the lab today:

## 2021-09-29 ENCOUNTER — Other Ambulatory Visit: Payer: Self-pay | Admitting: Gastroenterology

## 2021-09-29 ENCOUNTER — Other Ambulatory Visit: Payer: Managed Care, Other (non HMO)

## 2021-09-29 DIAGNOSIS — R109 Unspecified abdominal pain: Secondary | ICD-10-CM

## 2021-09-29 DIAGNOSIS — R197 Diarrhea, unspecified: Secondary | ICD-10-CM

## 2021-10-02 LAB — GI PROFILE, STOOL, PCR

## 2021-10-02 LAB — CALPROTECTIN, FECAL: Calprotectin, Fecal: <5 ug/g (ref 0–120)

## 2021-10-05 ENCOUNTER — Other Ambulatory Visit: Payer: Self-pay

## 2021-10-05 DIAGNOSIS — A0472 Enterocolitis due to Clostridium difficile, not specified as recurrent: Secondary | ICD-10-CM

## 2021-10-05 MED ORDER — VANCOMYCIN HCL 125 MG PO CAPS: 125.0000 mg | ORAL_CAPSULE | Freq: Four times a day (QID) | ORAL | 0 refills | Status: DC

## 2021-10-05 MED ORDER — VANCOMYCIN HCL 125 MG PO CAPS: 125.0000 mg | ORAL_CAPSULE | Freq: Four times a day (QID) | ORAL | 0 refills | Status: AC

## 2021-10-06 LAB — PANCREATIC ELASTASE, FECAL: Pancreatic Elastase-1, Stool: >500 mcg/g

## 2021-10-09 ENCOUNTER — Other Ambulatory Visit: Payer: Self-pay

## 2021-10-09 ENCOUNTER — Telehealth: Payer: Self-pay | Admitting: Gastroenterology

## 2021-10-09 DIAGNOSIS — R109 Unspecified abdominal pain: Secondary | ICD-10-CM

## 2021-10-09 DIAGNOSIS — R112 Nausea with vomiting, unspecified: Secondary | ICD-10-CM

## 2021-10-09 MED ORDER — DICYCLOMINE HCL 10 MG PO CAPS: 10.0000 mg | ORAL_CAPSULE | Freq: Three times a day (TID) | ORAL | 0 refills | Status: DC

## 2021-10-09 MED ORDER — ONDANSETRON HCL 4 MG PO TABS: 4.0000 mg | ORAL_TABLET | Freq: Three times a day (TID) | ORAL | 0 refills | Status: DC | PRN

## 2021-10-09 NOTE — Telephone Encounter (Signed)
Patient called states regarding the antibiotic that was prescribed to him he still having severe abdominal pain. Requesting a call back as soon s possible. Please call to advise.

## 2021-10-09 NOTE — Telephone Encounter (Signed)
Sorry to hear this. If stools are forming that is a good sign he should continue vancomycin if he can tolerate it. Would recommend Zofran for nausea and bentyl for his pain, if he does not already have that can you prescribe. If severe pain over the weekend, intolerance of PO, fevers, etc, he would need to go to the ED. Thanks

## 2021-10-09 NOTE — Telephone Encounter (Signed)
Pt was made aware of Dr. Havery Moros recommendations: Prescriptions sent to pharmacy: Pt made aware: Pt verbalized understanding with all questions answered.

## 2021-10-09 NOTE — Telephone Encounter (Signed)
Dr. Lyndel Safe Pt:  Recently diagnosed with C diff and being treated with Vancomycin: Pt states that his stools are not as liquid as they were and he feels that they are stating to get more formed although after starting the Vancomycin he is starting to have lots of nausea and dry heaves. Pt states that he is also having abdominal pain and feels like his stomach is in knots:  Please advise as DOD

## 2021-10-14 ENCOUNTER — Encounter: Payer: Managed Care, Other (non HMO) | Admitting: Gastroenterology

## 2021-10-14 ENCOUNTER — Other Ambulatory Visit: Payer: Self-pay

## 2021-10-14 DIAGNOSIS — R109 Unspecified abdominal pain: Secondary | ICD-10-CM

## 2021-10-14 MED ORDER — TRAMADOL-ACETAMINOPHEN 37.5-325 MG PO TABS: 1.0000 | ORAL_TABLET | Freq: Three times a day (TID) | ORAL | 0 refills | Status: DC | PRN

## 2021-10-14 NOTE — Telephone Encounter (Signed)
Can call in Ultracet 1 tab PO Q8hrs prn #30, NR Cannot drink any alcohol visit. Continue rest of the medications Please let us know if diarrhea worsens.  In that case, he may need another course of vancomycin. I have reviewed his last CT.  Can continue with Bentyl for now. RG

## 2021-10-14 NOTE — Telephone Encounter (Signed)
Pt was made aware of Dr. Lyndel Safe recommendations: Prescription was faxed to [facility]: Pt made aware: Pt verbalized understanding with all questions answered.

## 2021-10-14 NOTE — Telephone Encounter (Signed)
Pt recently diagnosed with C Diff: Treated with Vanc, Pt has one day left of vanc: Pt recently started on Zofran along with Bentyl on the Sept 22nd. Pt stated that he felt a lot better Sept 23 -Sept 26 and today he woke up with the Abdominal pain again and has 5 soft BM this AM; Pt does have an appointment with Carl Best NP on 10/19/2021: Pt requesting is there anything he can try prior to his appointment to relieve the pain: Please review and advise

## 2021-10-19 ENCOUNTER — Ambulatory Visit (INDEPENDENT_AMBULATORY_CARE_PROVIDER_SITE_OTHER): Payer: Managed Care, Other (non HMO) | Admitting: Nurse Practitioner

## 2021-10-19 ENCOUNTER — Other Ambulatory Visit (INDEPENDENT_AMBULATORY_CARE_PROVIDER_SITE_OTHER): Payer: Managed Care, Other (non HMO)

## 2021-10-19 ENCOUNTER — Encounter: Payer: Self-pay | Admitting: Nurse Practitioner

## 2021-10-19 VITALS — BP 118/86 | HR 82 | Resp 96 | Ht 73.0 in | Wt 214.0 lb

## 2021-10-19 DIAGNOSIS — R112 Nausea with vomiting, unspecified: Secondary | ICD-10-CM

## 2021-10-19 DIAGNOSIS — R109 Unspecified abdominal pain: Secondary | ICD-10-CM

## 2021-10-19 DIAGNOSIS — K589 Irritable bowel syndrome without diarrhea: Secondary | ICD-10-CM

## 2021-10-19 DIAGNOSIS — A0472 Enterocolitis due to Clostridium difficile, not specified as recurrent: Secondary | ICD-10-CM

## 2021-10-19 LAB — BASIC METABOLIC PANEL WITH GFR
BUN: 18 mg/dL (ref 6–23)
CO2: 29 meq/L (ref 19–32)
Calcium: 9.8 mg/dL (ref 8.4–10.5)
Chloride: 103 meq/L (ref 96–112)
Creatinine, Ser: 1.34 mg/dL (ref 0.40–1.50)
GFR: 63.58 mL/min
Glucose, Bld: 83 mg/dL (ref 70–99)
Potassium: 4.3 meq/L (ref 3.5–5.1)
Sodium: 139 meq/L (ref 135–145)

## 2021-10-19 LAB — CBC WITH DIFFERENTIAL/PLATELET
Basophils Absolute: 0 10*3/uL (ref 0.0–0.1)
Basophils Relative: 0.4 % (ref 0.0–3.0)
Eosinophils Absolute: 0.1 10*3/uL (ref 0.0–0.7)
Eosinophils Relative: 1.9 % (ref 0.0–5.0)
HCT: 45.3 % (ref 39.0–52.0)
Hemoglobin: 15.5 g/dL (ref 13.0–17.0)
Lymphocytes Relative: 20 % (ref 12.0–46.0)
Lymphs Abs: 1.3 10*3/uL (ref 0.7–4.0)
MCHC: 34.3 g/dL (ref 30.0–36.0)
MCV: 84.9 fl (ref 78.0–100.0)
Monocytes Absolute: 0.5 10*3/uL (ref 0.1–1.0)
Monocytes Relative: 8.1 % (ref 3.0–12.0)
Neutro Abs: 4.6 10*3/uL (ref 1.4–7.7)
Neutrophils Relative %: 69.6 % (ref 43.0–77.0)
Platelets: 284 10*3/uL (ref 150.0–400.0)
RBC: 5.34 Mil/uL (ref 4.22–5.81)
RDW: 13.2 % (ref 11.5–15.5)
WBC: 6.7 10*3/uL (ref 4.0–10.5)

## 2021-10-19 MED ORDER — VANCOMYCIN HCL 125 MG PO CAPS: ORAL_CAPSULE | ORAL | 0 refills | Status: AC

## 2021-10-19 MED ORDER — DICYCLOMINE HCL 10 MG PO CAPS: 10.0000 mg | ORAL_CAPSULE | Freq: Three times a day (TID) | ORAL | 1 refills | Status: DC

## 2021-10-19 MED ORDER — ONDANSETRON HCL 4 MG PO TABS: 4.0000 mg | ORAL_TABLET | Freq: Three times a day (TID) | ORAL | 1 refills | Status: DC | PRN

## 2021-10-19 NOTE — Progress Notes (Signed)
10/19/2021 ZAKIAH BECKERMAN [id number] 1975/05/31   Chief Complaint: Diarrhea, abdominal pain   History of Present Illness: Shaun Boyer is a 46 year old male with past medical history of sleep apnea uses CPAP, Gibert' syndrome, splenomegaly, GERD, IBS and diverticulitis. He is followed by Dr. Chales Abrahams.  He was last treated for diverticulitis with a 10-day course of Augmentin 08/18/2021.  Follow up CTAP 09/07/2021 with contrast showed mild fluid within the rectosigmoid colon with some mucosal enhancement and diverticular disease of the descending and sigmoid colon without convincing evidence for diverticulitis.  He underwent a colonoscopy by Dr. Chales Abrahams 09/15/2021 which identified sigmoid diverticulosis and internal hemorrhoids.  No evidence of colitis or malignancy.  He developed abdominal cramping described as knots in his stomach for which she was prescribed Bentyl 10 mg 4 times daily for 2 weeks per Dr. Chales Abrahams.  His abdominal cramping pain initially improved then recurred.  He contacted our office on 09/28/2021 with complaints of worsening generalized to lower abdominal pain and diarrhea.  He was prescribed Xifaxan 550 mg p.o. 3 times daily and laboratory studies were ordered including a GI pathogen panel.  The GI pathogen panel was positive for C. difficile 09/29/2021 and Xifaxan was discontinued and he was prescribed Vancomycin 125 mg 1 p.o. 4 times daily for 10 days.  He took Land for a few doses but stopped it as his cramping was worse after he took it.  He stated his lower abdominal pain and diarrhea abated within 5 to 7 days after taking the Vancomycin.  He stated feeling better than he had in the past year and he was passing normal solid stools.  His last dose of vancomycin was taken on Thursday 10/15/2021.  His central abdominal pain recurred over the past 1 to 2 days which is unlike his LLQ pain he experienced in the past when diagnosed with diverticulitis.  He passed a normal formed brown bowel  movement earlier this morning followed by a soft stool then 2-3 episodes of nonbloody diarrhea.  He continues to have nausea which is controlled after taking Zofran 4 mg once daily.  He is scheduled to see his dentist tomorrow as he is having dental pain, possible abscessed tooth which might require antibiotic therapy.  No fevers.  He is eating a bland diet.      Latest Ref Rng & Units 09/28/2021    4:29 PM 07/03/2020   10:35 PM  CBC  WBC 4.0 - 10.5 K/uL 5.5  10.5   Hemoglobin 13.0 - 17.0 g/dL 08.1  44.8   Hematocrit 39.0 - 52.0 % 46.1  45.9   Platelets 150.0 - 400.0 K/uL 224.0  252        Latest Ref Rng & Units 09/28/2021    4:29 PM 09/03/2021   12:33 PM 07/03/2020   10:35 PM  CMP  Glucose 70 - 99 mg/dL 91  85  185   BUN 6 - 23 mg/dL 13  14  10    Creatinine 0.40 - 1.50 mg/dL  6.31  4.97   Sodium 135 - 145 mEq/L 139  140  138   Potassium 3.5 - 5.1 mEq/L 3.5  3.8  3.2   Chloride 96 - 112 mEq/L 103  105  103   CO2 19 - 32 mEq/L 29  26  25    Calcium 8.4 - 10.5 mg/dL 9.8  9.4  9.4   Total Protein 6.0 - 8.3 g/dL 7.4   7.3   Total Bilirubin 0.2 -  1.2 mg/dL 1.9   3.5   Alkaline Phos 39 - 117 U/L 65   80   AST 0 - 37 U/L 17   20   ALT 0 - 53 U/L 28   40   Pancreatic elastase level > 500 and Fecal calprotectin level < 5 09/29/2021.  CTAP 09/07/2021: FINDINGS: Lower chest: Lung bases demonstrate no acute airspace disease.   Hepatobiliary: No focal liver abnormality is seen. Status post cholecystectomy. No biliary dilatation.   Pancreas: Unremarkable. No pancreatic ductal dilatation or surrounding inflammatory changes.   Spleen: Normal in size without focal abnormality.   Adrenals/Urinary Tract: Adrenal glands are unremarkable. Kidneys are normal, without renal calculi, focal lesion, or hydronephrosis. Bladder is unremarkable.   Stomach/Bowel: The stomach is nonenlarged. No dilated small bowel. Negative appendix. Diverticular disease of the left colon without acute inflammatory  process. Fluid within the rectosigmoid colon with some mucosal enhancement. Focal area of narrowing at the rectosigmoid colon, series 2, image 71, could relate to an area of spasm, no masslike thickening is seen.   Vascular/Lymphatic: No significant vascular findings are present. No enlarged abdominal or pelvic lymph nodes.   Reproductive: Prostate is unremarkable.   Other: Negative for pelvic effusion or free air   Musculoskeletal: No acute or significant osseous findings.   IMPRESSION: 1. Mild fluid within the rectosigmoid colon with some mucosal enhancement, question mild proctocolitis. Diverticular disease of the descending and sigmoid colon without convincing evidence for diverticulitis.   MOST RECENT GI PROCEDURES:  Colonoscopy 09/15/2021 by Dr. Chales Abrahams: - Moderate predominantly sigmoid diverticulosis. - Non-bleeding internal hemorrhoids. - Otherwise normal colonoscopy to TI. -10-year colonoscopy recall 1. Surgical [P], small bowel, terminal ileum - ILEAL MUCOSA WITH BENIGN LYMPHOID AGGREGATES. - NEGATIVE FOR ACTIVE INFLAMMATION, CHRONIC CHANGES OR GRANULOMAS. 2. Surgical [P], random colon - COLONIC MUCOSA WITH NO SIGNIFICANT PATHOLOGIC CHANGES. - NO MICROSCOPIC COLITIS, ACTIVE INFLAMMATION OR GRANULOMAS.  Past Medical History:  Diagnosis Date   Diarrhea    predominant irritable syndrome   GERD (gastroesophageal reflux disease)    Gilbert's disease    Obstructive sleep apnea    on CPAP   Sleep apnea    Spleen enlarged    Past Surgical History:  Procedure Laterality Date   CHOLECYSTECTOMY  01/18/2005   COLONOSCOPY     2003   WISDOM TOOTH EXTRACTION     Current Outpatient Medications on File Prior to Visit  Medication Sig Dispense Refill   sertraline (ZOLOFT) 50 MG tablet Take 1 tablet by mouth Daily at 5am.     traMADol (ULTRAM) 50 MG tablet Take 1 tablet (50 mg total) by mouth every 6 (six) hours as needed. 30 tablet 0   traMADol-acetaminophen (ULTRACET)  37.5-325 MG tablet Take 1 tablet by mouth every 8 (eight) hours as needed. 30 tablet 0   loratadine (CLARITIN) 10 MG tablet Take 10 mg by mouth daily. (Patient not taking: Reported on 10/19/2021)     naproxen (NAPROSYN) 500 MG tablet  (Patient not taking: Reported on 09/03/2021)     omeprazole (PRILOSEC) 40 MG capsule Take 40 mg by mouth daily. (Patient not taking: Reported on 10/19/2021)     oxyCODONE-acetaminophen (PERCOCET/ROXICET) 5-325 MG tablet Take 1 tablet by mouth every 4 (four) hours as needed for severe pain. (Patient not taking: Reported on 09/03/2021) 15 tablet 0   propranolol ER (INDERAL LA) 60 MG 24 hr capsule Take 60 mg by mouth daily. (Patient not taking: Reported on 10/19/2021)     No current facility-administered medications  on file prior to visit.   No Known Allergies   Current Medications, Allergies, Past Medical History, Past Surgical History, Family History and Social History were reviewed in [employer] record.  Review of Systems:   Constitutional: Negative for fever, sweats, chills or weight loss.  Respiratory: Negative for shortness of breath.   Cardiovascular: Negative for chest pain, palpitations and leg swelling.  Gastrointestinal: See HPI.  Musculoskeletal: Negative for back pain or muscle aches.  Neurological: Negative for dizziness, headaches or paresthesias.   Physical Exam: BP 118/86   Pulse 82   Resp (!) 96   Ht 6\' 1"  (1.854 m)   Wt 214 lb (97.1 kg)   BMI 28.23 kg/m   General: 46 year old male in no acute distress. Head: Normocephalic and atraumatic. Eyes: No scleral icterus. Conjunctiva pink . Ears: Normal auditory acuity. Mouth: Dentition intact. No ulcers or lesions.  Lungs: Clear throughout to auscultation. Heart: Regular rate and rhythm, no murmur. Abdomen: Soft, nondistended.  Mild central abdominal tenderness without rebound or guarding.  No masses or hepatomegaly. Normal bowel sounds x 4 quadrants.  Rectal:  Deferred. Musculoskeletal: Symmetrical with no gross deformities. Extremities: No edema. Neurological: Alert oriented x 4. No focal deficits.  Psychological: Alert and cooperative. Normal mood and affect  Assessment and Recommendations:  26) 46 year old male with C. difficile diarrhea which occurred after he took antibiotics for diverticulitis 08/2021. S/P colonoscopy 09/15/2021 which showed diverticulosis without evidence of colitis.  GI pathogen panel + for C. Diff on 09/29/2021.  Patient was prescribed Vancomycin 125 mg 1 p.o. 4 times daily for 10 days.  His diarrhea and abdominal pain abated within 7 days on Vanco but recurred 3 to 4 days after completing the last dose.  -Vancomycin 125 mg 1 capsule p.o. 4 times daily for 14 days then 1 capsule p.o. 3 times daily for 7 days then 1 capsule p.o. twice daily for 7 days then 1 capsule p.o. daily for 7 days then 1 capsule every third day for 2 weeks. -CBC and BMP -Patient to contact the office in 48 hours with an update, sooner if symptoms worsen -Work excuse 10/2-10/3 provided -Push fluids, bland diet -Avoid antibiotics unless absolutely necessary.  Patient may have an abscessed tooth, he is scheduled to see his dentist tomorrow.  -CTAP to rule out diverticulitis if abdominal pain persists or worsens -Patient instructed to go the emergency room if he develops severe abdominal pain or worsening diarrhea  Today's encounter was 25 minutes which included precharting, chart/result review, history/exam, face-to-face time used for counseling, formulating treatment plan with follow-up and documentation.

## 2021-10-19 NOTE — Patient Instructions (Addendum)
_______________________________________________________  If you are age 46 or older, your body mass index should be between 23-30. Your Body mass index is 28.23 kg/m. If this is out of the aforementioned range listed, please consider follow up with your Primary Care Provider.  If you are age 70 or younger, your body mass index should be between 19-25. Your Body mass index is 28.23 kg/m. If this is out of the aformentioned range listed, please consider follow up with your Primary Care Provider.   ________________________________________________________  The [facility] providers would like to encourage you to use [facility] to communicate with providers for non-urgent requests or questions.  Due to long hold times on the telephone, sending your provider a message by [facility] may be a faster and more efficient way to get a response.  Please allow 48 business hours for a response.  Please remember that this is for non-urgent requests.  _______________________________________________________  We have sent the following medications to your pharmacy for you to pick up at your convenience: Vancomycin taper Zofran (ondansetron) Bentyl (dicyclomine)  Please go to the lab in the basement of our building to have lab work done as you leave today. Hit "B" for basement when you get on the elevator.  When the doors open the lab is on your left.  We will call you with the results. Thank you.  Thank you for entrusting me with your care and for choosing [facility] [place]

## 2021-10-23 ENCOUNTER — Telehealth: Payer: Self-pay | Admitting: Nurse Practitioner

## 2021-10-23 ENCOUNTER — Other Ambulatory Visit: Payer: Self-pay | Admitting: Gastroenterology

## 2021-10-23 DIAGNOSIS — R109 Unspecified abdominal pain: Secondary | ICD-10-CM

## 2021-10-23 NOTE — Telephone Encounter (Signed)
Patient called is requesting a nurse to give him a call back states he is still having gut issues and is on antibiotic.

## 2021-10-23 NOTE — Telephone Encounter (Signed)
Pt states that he recently seen Carl Best NP on 10/19/2021. Pt stated his is feeling much better in his abdomen with only a very slight abdominal pain and his BM's are now formed: Pt stated that he went to his dentist and they notified him that he has a tooth abscess and started him on antibiotics:   Pt is requesting a prescription for Toradol for the pain for his tooth. Pt was notified that he will need to reach out to his dentist or Urgent Care for his tooth whereas this is not GI related: Pt verbalized understanding with all questions answered.

## 2021-11-08 NOTE — Progress Notes (Signed)
Agree with assessment/plan.  Raj Gordie Belvin, MD [facility] [phone]  

## 2021-11-09 IMAGING — CT CT ABD-PELV W/ CM
2 of 5 series · 16 of 46 positions shown, 18 images · IV contrast (Omni 300)
Comparison: 07/13/2019

CLINICAL DATA: Abdominal pain with history of diverticulitis

EXAM:
CT ABDOMEN AND PELVIS WITH CONTRAST
TECHNIQUE: Multidetector CT imaging of the abdomen and pelvis was performed
using the standard protocol following bolus administration of
intravenous contrast.
CONTRAST:  100mL OMNIPAQUE IOHEXOL 300 MG/ML  SOLN

[Series 3: a/p w/ 5mm · axial · 0.84mm/px · z∈[+826,+1270]mm · 13 of 101 slices shown, 15 images]
[im 6/101  soft-tissue]
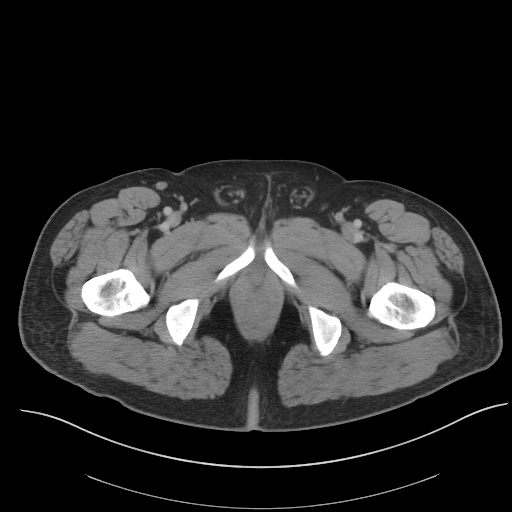
[im 6/101  bone]
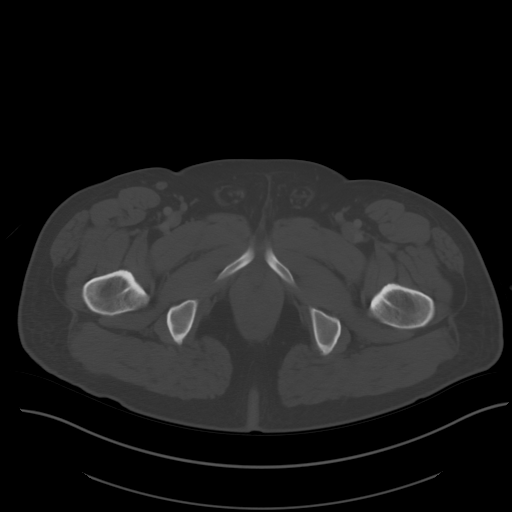
[im 16/101  soft-tissue]
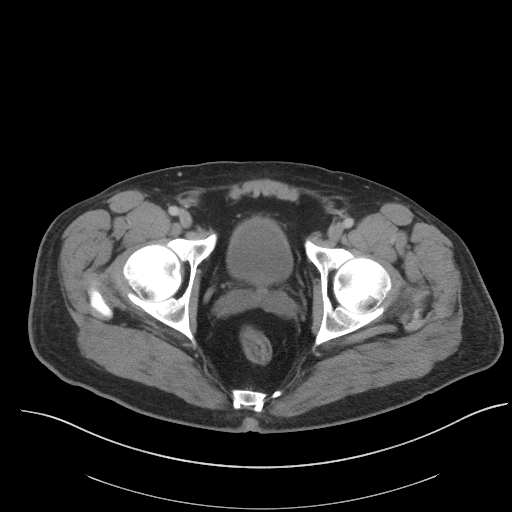
[im 22/101  soft-tissue]
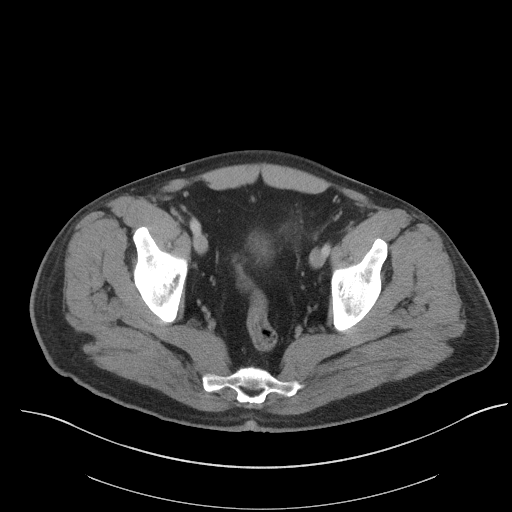
[im 27/101  soft-tissue]
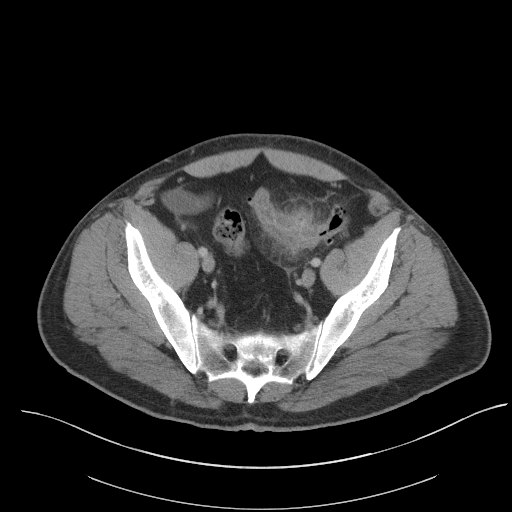
[im 37/101  soft-tissue]
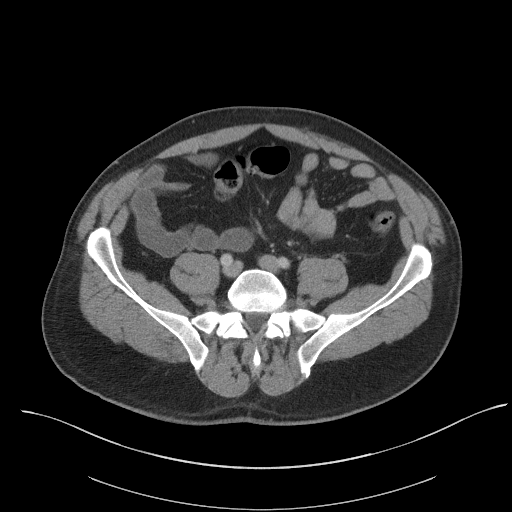
[im 43/101  soft-tissue]
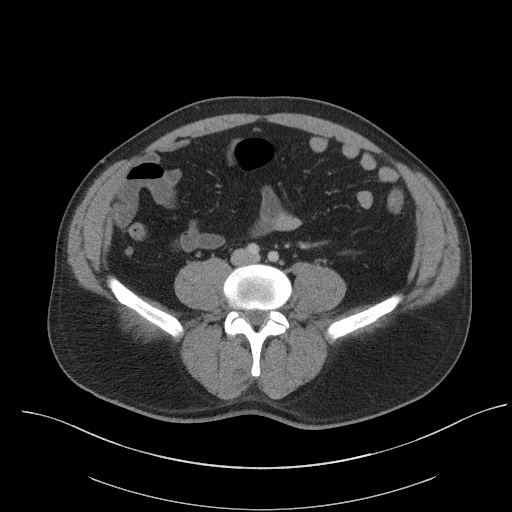
[im 53/101  soft-tissue]
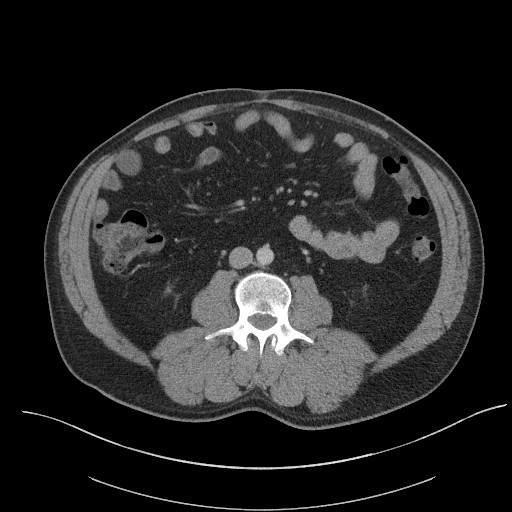
[im 58/101  soft-tissue]
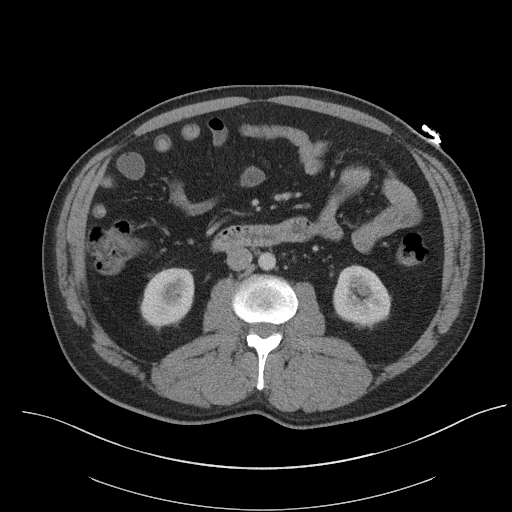
[im 64/101  soft-tissue]
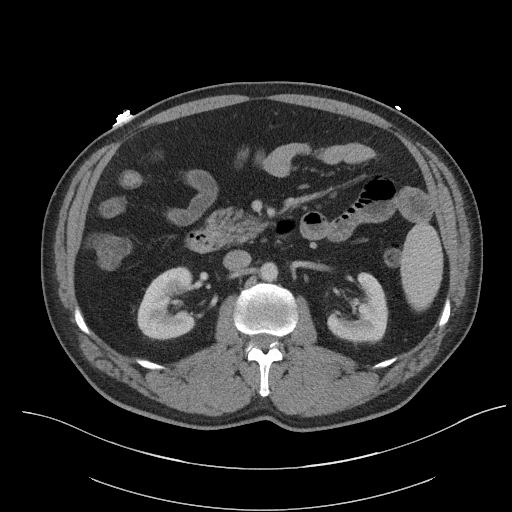
[im 64/101  bone]
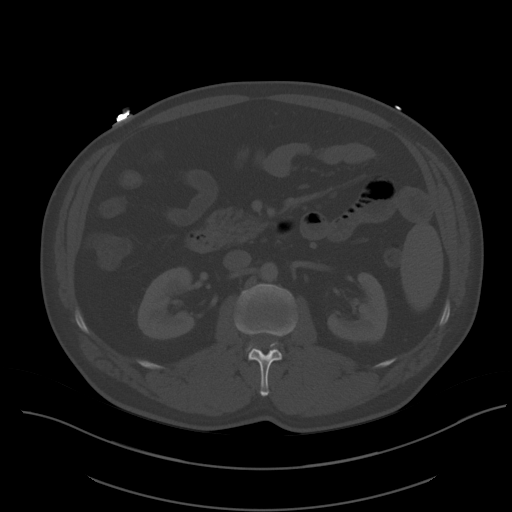
[im 74/101  soft-tissue]
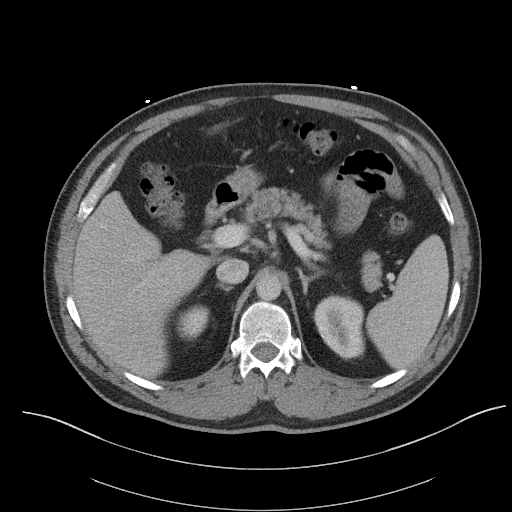
[im 79/101  soft-tissue]
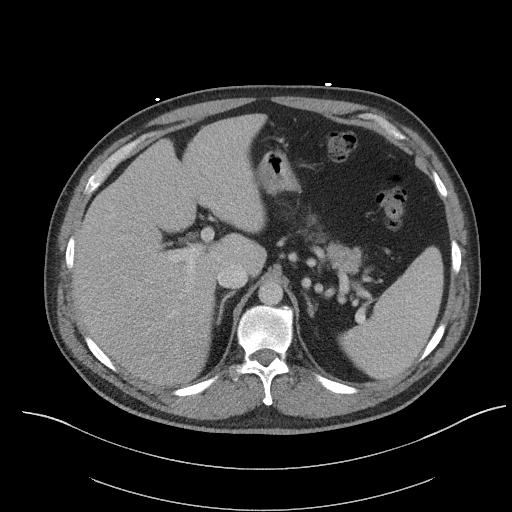
[im 85/101  soft-tissue]
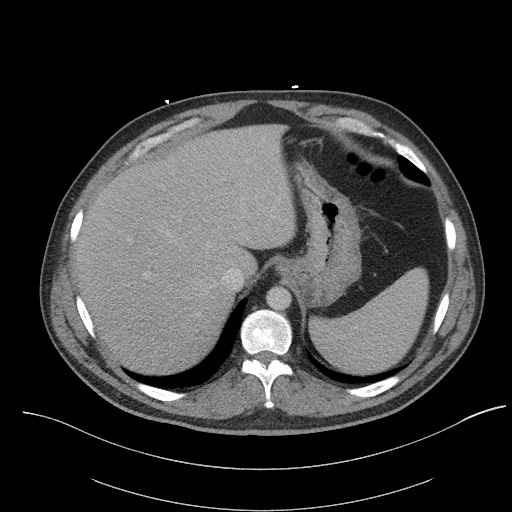
[im 95/101  soft-tissue]
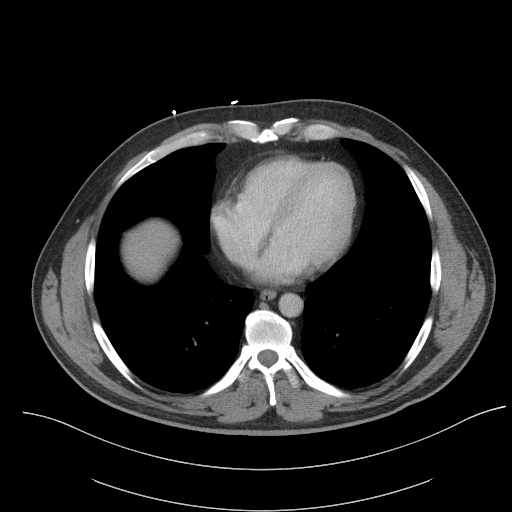

[Series 5: a/p w/ cor · coronal · 0.79mm/px · 3 of 151 slices shown]
[im 51/151  soft-tissue]
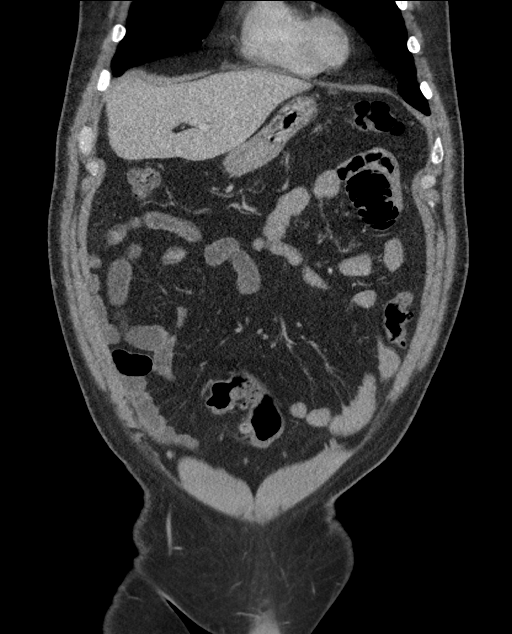
[im 67/151  soft-tissue]
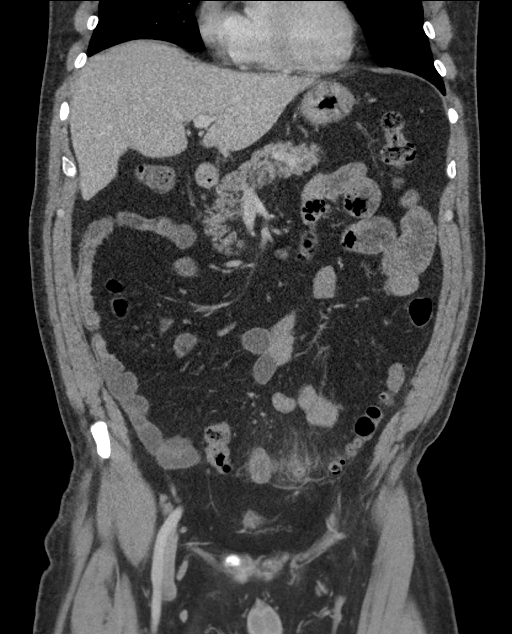
[im 84/151  soft-tissue]
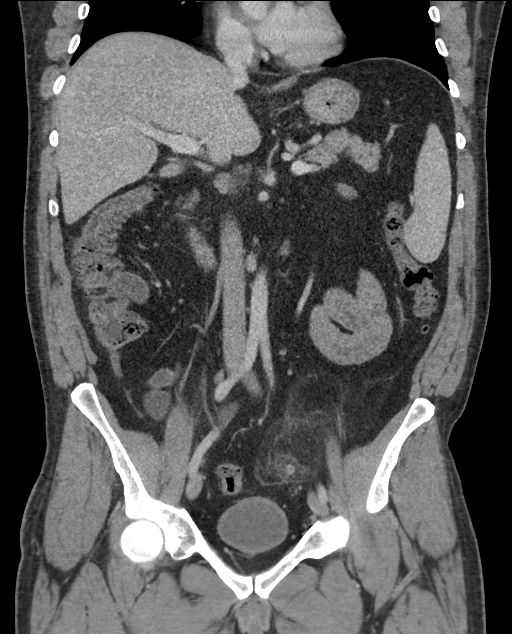

[16 of 46 positions shown; findings below may reference images not displayed]

FINDINGS: LOWER CHEST: Normal.

HEPATOBILIARY: Normal hepatic contours. No intra- or extrahepatic
biliary dilatation. Status post cholecystectomy.

PANCREAS: Normal pancreas. No ductal dilatation or peripancreatic
fluid collection.

SPLEEN: Normal.

ADRENALS/URINARY TRACT: The adrenal glands are normal. No
hydronephrosis, nephroureterolithiasis or solid renal mass. The
urinary bladder is normal for degree of distention

STOMACH/BOWEL: There is no hiatal hernia. Normal duodenal course and
caliber. No small bowel dilatation or inflammation. Sigmoid
diverticulosis with acute inflammation. No free intraperitoneal air
or fluid collection. Normal appendix.

VASCULAR/LYMPHATIC: Normal course and caliber of the major abdominal
vessels. No abdominal or pelvic lymphadenopathy.

REPRODUCTIVE: There are calcifications within the normal-sized
prostate. Symmetric seminal vesicles.

MUSCULOSKELETAL. No bony spinal canal stenosis or focal osseous
abnormality.

OTHER: None.
IMPRESSION: Acute sigmoid diverticulitis without abscess or free intraperitoneal
air.

## 2021-11-24 NOTE — Telephone Encounter (Signed)
Shaun Boyer, Patient has been on a prolonged Vanco taper for C. Diff. Pls contact patient and instruct him to go on a bland diet, push fluids. Patient to call office in 1 to 2 days if he continues to have abd pain and if he develops looser stools. I am adding Dr .Lyndel Safe to this message so he can weigh in.  If there is concern of recurrent C. Diff, he may need to try Dificid.  Dr. Lyndel Safe, let me know if you have other recommendations at this time. THX

## 2021-12-18 ENCOUNTER — Other Ambulatory Visit: Payer: Self-pay | Admitting: Nurse Practitioner

## 2021-12-18 DIAGNOSIS — R109 Unspecified abdominal pain: Secondary | ICD-10-CM

## 2021-12-18 NOTE — Telephone Encounter (Signed)
Refill sent to pharmacy.   

## 2021-12-18 NOTE — Telephone Encounter (Signed)
Patient was last seen in Oct. Please advise.

## 2022-02-15 NOTE — Telephone Encounter (Signed)
Pt contacted in regard to recent My Chart Messages. Pt stated that he had some discomfort in his abdomen last week for a bout 2 or 3 days. Pt stated that he would not even call it pain. Pt stated that yesterday and today he is feeling OK. No discomfort. Regular BM this AM.  Pt notified to continue to monitor symptoms and if symptoms worsen to call our office:  Pt verbalized understanding with all questions answered.

## 2022-03-03 ENCOUNTER — Telehealth: Payer: Self-pay | Admitting: Nurse Practitioner

## 2022-03-03 NOTE — Telephone Encounter (Signed)
Incoming call from patient returning Shaun Boyer's call.

## 2022-03-03 NOTE — Telephone Encounter (Signed)
Unable to reach pt by phone or leave voice message.  Pt has no voice mail

## 2022-03-04 NOTE — Telephone Encounter (Signed)
Pt stated that he Started having pressure on his left side on Monday  that was (not to the point of hurting that is typically associated with his  Diverticulitis) along with cramping that lasted until yesterday. Monday and Tuesday had diarrhea. No BM yesterday or today. Pt stated that he feels fine today with no pain or pressure thus far.  Please advise

## 2022-03-04 NOTE — Telephone Encounter (Signed)
Remo Lipps, pls check on patient 03/05/2022 for further update. THX.

## 2022-03-05 NOTE — Telephone Encounter (Signed)
Pt notified of Carl Best NP recommendations. Pt verbalized understanding with all questions answered.

## 2022-03-05 NOTE — Telephone Encounter (Signed)
Pt stated that yesterday he had 2 soft BM's and today he has had 1 soft/formed BM. No abdominal pain/pressure Today. Pt stated that he is just worried due to him being so sick before that he will get there again.

## 2022-03-05 NOTE — Telephone Encounter (Signed)
Appreciate the update, no abd pain and he is having soft to formed stools which is good news. Pt to contact our office if he has recurrence of abd pain or if he develops diarrhea. Thx .

## 2022-03-17 NOTE — Telephone Encounter (Signed)
Shaun Boyer, patient has not been seen in office  since 10/2021. Pls send him to the lab for a CBC, CMP. If he is having pain similar to what he's experienced with past diverticulitis schedule him for a CTAP with contrast. PT to go to the ED if he develops worsening abdominal pain or fever. Schedule him for an office follow up. Pls  verify if patient is having left upper or left lower abd pain. THX

## 2022-03-18 ENCOUNTER — Other Ambulatory Visit: Payer: Self-pay

## 2022-03-18 DIAGNOSIS — R197 Diarrhea, unspecified: Secondary | ICD-10-CM

## 2022-03-18 DIAGNOSIS — R109 Unspecified abdominal pain: Secondary | ICD-10-CM

## 2022-03-18 DIAGNOSIS — A0472 Enterocolitis due to Clostridium difficile, not specified as recurrent: Secondary | ICD-10-CM

## 2022-03-18 DIAGNOSIS — R1032 Left lower quadrant pain: Secondary | ICD-10-CM

## 2022-03-18 NOTE — Telephone Encounter (Signed)
Pt made aware of Carl Best NP recommendations: Pt stated that he is having mid lower abdominal pain along with left abdomen pressure similar to prior.   Orders for labs placed in Epic.  Pt was scheduled for CT on 03/23/2022 at 2:00 PM at [facility]. . Pt to arrive at 12:00 PM. Nothing to eat or drink 4 hours prior.   Left message for pt to call back

## 2022-03-19 ENCOUNTER — Other Ambulatory Visit (INDEPENDENT_AMBULATORY_CARE_PROVIDER_SITE_OTHER): Payer: 59

## 2022-03-19 DIAGNOSIS — R109 Unspecified abdominal pain: Secondary | ICD-10-CM | POA: Diagnosis not present

## 2022-03-19 DIAGNOSIS — R197 Diarrhea, unspecified: Secondary | ICD-10-CM

## 2022-03-19 LAB — CBC WITH DIFFERENTIAL/PLATELET
Basophils Absolute: 0 10*3/uL (ref 0.0–0.1)
Basophils Relative: 0.4 % (ref 0.0–3.0)
Eosinophils Absolute: 0.1 10*3/uL (ref 0.0–0.7)
Eosinophils Relative: 2 % (ref 0.0–5.0)
HCT: 46.3 % (ref 39.0–52.0)
Hemoglobin: 16.3 g/dL (ref 13.0–17.0)
Lymphocytes Relative: 29.3 % (ref 12.0–46.0)
Lymphs Abs: 1.4 10*3/uL (ref 0.7–4.0)
MCHC: 35.1 g/dL (ref 30.0–36.0)
MCV: 84.5 fl (ref 78.0–100.0)
Monocytes Absolute: 0.4 10*3/uL (ref 0.1–1.0)
Monocytes Relative: 8.7 % (ref 3.0–12.0)
Neutro Abs: 2.8 10*3/uL (ref 1.4–7.7)
Neutrophils Relative %: 59.6 % (ref 43.0–77.0)
Platelets: 224 10*3/uL (ref 150.0–400.0)
RBC: 5.48 Mil/uL (ref 4.22–5.81)
RDW: 13.3 % (ref 11.5–15.5)
WBC: 4.8 10*3/uL (ref 4.0–10.5)

## 2022-03-19 LAB — COMPREHENSIVE METABOLIC PANEL
ALT: 20 U/L (ref 0–53)
AST: 13 U/L (ref 0–37)
Albumin: 4.4 g/dL (ref 3.5–5.2)
Alkaline Phosphatase: 53 U/L (ref 39–117)
BUN: 17 mg/dL (ref 6–23)
CO2: 28 mEq/L (ref 19–32)
Calcium: 9.7 mg/dL (ref 8.4–10.5)
Chloride: 103 mEq/L (ref 96–112)
Creatinine, Ser: 1.17 mg/dL (ref 0.40–1.50)
GFR: 74.61 mL/min (ref >60.00)
Glucose, Bld: 91 mg/dL (ref 70–99)
Potassium: 4.1 mEq/L (ref 3.5–5.1)
Sodium: 139 mEq/L (ref 135–145)
Total Bilirubin: 3 mg/dL — ABNORMAL HIGH (ref 0.2–1.2)
Total Protein: 6.9 g/dL (ref 6.0–8.3)

## 2022-03-19 NOTE — Telephone Encounter (Signed)
Pt contacted to notify about CT scan. Pt stated that he did have his labs drawn today. Pt notified of the  CT on 03/23/2022 at 2:00 PM at [facility]. . Pt to arrive at 12:00 PM. Nothing to eat or drink 4 hours prior.    Pt was notified in regard to wanting to be checked for a tick disease that he could reach out to his PCP for that.  Pt verbalized understanding with all questions answered.

## 2022-03-23 ENCOUNTER — Ambulatory Visit (HOSPITAL_BASED_OUTPATIENT_CLINIC_OR_DEPARTMENT_OTHER): Admission: RE | Admit: 2022-03-23 | Payer: 59 | Source: Ambulatory Visit

## 2022-03-23 ENCOUNTER — Telehealth: Payer: Self-pay | Admitting: Nurse Practitioner

## 2022-03-23 ENCOUNTER — Emergency Department (HOSPITAL_COMMUNITY): Payer: 59

## 2022-03-23 ENCOUNTER — Emergency Department (HOSPITAL_COMMUNITY)
Admission: EM | Admit: 2022-03-23 | Discharge: 2022-03-23 | Disposition: A | Discharge status: Home / Self Care | Payer: 59 | Attending: Emergency Medicine | Admitting: Emergency Medicine

## 2022-03-23 ENCOUNTER — Encounter (HOSPITAL_BASED_OUTPATIENT_CLINIC_OR_DEPARTMENT_OTHER): Payer: Self-pay

## 2022-03-23 ENCOUNTER — Other Ambulatory Visit: Payer: Self-pay

## 2022-03-23 DIAGNOSIS — R1032 Left lower quadrant pain: Secondary | ICD-10-CM | POA: Insufficient documentation

## 2022-03-23 DIAGNOSIS — R109 Unspecified abdominal pain: Secondary | ICD-10-CM | POA: Diagnosis present

## 2022-03-23 LAB — COMPREHENSIVE METABOLIC PANEL
ALT: 24 U/L (ref 0–44)
AST: 18 U/L (ref 15–41)
Albumin: 4.6 g/dL (ref 3.5–5.0)
Alkaline Phosphatase: 55 U/L (ref 38–126)
Anion gap: 14 (ref 5–15)
BUN: 18 mg/dL (ref 6–20)
CO2: 24 mmol/L (ref 22–32)
Calcium: 9.6 mg/dL (ref 8.9–10.3)
Chloride: 103 mmol/L (ref 98–111)
Creatinine, Ser: 1.17 mg/dL (ref 0.61–1.24)
GFR, Estimated: >60 mL/min (ref >60)
Glucose, Bld: 112 mg/dL — ABNORMAL HIGH (ref 70–99)
Potassium: 4.1 mmol/L (ref 3.5–5.1)
Sodium: 141 mmol/L (ref 135–145)
Total Bilirubin: 2.9 mg/dL — ABNORMAL HIGH (ref 0.3–1.2)
Total Protein: 7.4 g/dL (ref 6.5–8.1)

## 2022-03-23 LAB — URINALYSIS, ROUTINE W REFLEX MICROSCOPIC
Bilirubin Urine: NEGATIVE
Glucose, UA: NEGATIVE mg/dL
Hgb urine dipstick: NEGATIVE
Ketones, ur: 5 mg/dL — AB
Leukocytes,Ua: NEGATIVE
Nitrite: NEGATIVE
Protein, ur: NEGATIVE mg/dL
Specific Gravity, Urine: 1.044 — ABNORMAL HIGH (ref 1.005–1.030)
pH: 5 (ref 5.0–8.0)

## 2022-03-23 LAB — CBC
HCT: 47 % (ref 39.0–52.0)
Hemoglobin: 16.3 g/dL (ref 13.0–17.0)
MCH: 29.5 pg (ref 26.0–34.0)
MCHC: 34.7 g/dL (ref 30.0–36.0)
MCV: 85 fL (ref 80.0–100.0)
Platelets: 252 10*3/uL (ref 150–400)
RBC: 5.53 MIL/uL (ref 4.22–5.81)
RDW: 13.2 % (ref 11.5–15.5)
WBC: 5.2 10*3/uL (ref 4.0–10.5)
nRBC: 0 % (ref 0.0–0.2)

## 2022-03-23 LAB — LIPASE, BLOOD: Lipase: 34 U/L (ref 11–51)

## 2022-03-23 LAB — C DIFFICILE QUICK SCREEN W PCR REFLEX
C Diff antigen: NEGATIVE
C Diff interpretation: NOT DETECTED
C Diff toxin: NEGATIVE

## 2022-03-23 MED ORDER — ONDANSETRON HCL 4 MG PO TABS: 4.0000 mg | ORAL_TABLET | Freq: Four times a day (QID) | ORAL | 0 refills | Status: DC | PRN

## 2022-03-23 MED ORDER — FENTANYL CITRATE PF 50 MCG/ML IJ SOSY
50.0000 ug | PREFILLED_SYRINGE | Freq: Once | INTRAMUSCULAR | Status: AC
  Administered 2022-03-23: 50 ug via INTRAVENOUS
  Filled 2022-03-23: qty 1

## 2022-03-23 MED ORDER — SODIUM CHLORIDE 0.9 % IV BOLUS
500.0000 mL | Freq: Once | INTRAVENOUS | Status: AC
  Administered 2022-03-23: 500 mL via INTRAVENOUS

## 2022-03-23 MED ORDER — ONDANSETRON HCL 4 MG/2ML IJ SOLN
4.0000 mg | Freq: Once | INTRAMUSCULAR | Status: AC | PRN
  Administered 2022-03-23: 4 mg via INTRAVENOUS
  Filled 2022-03-23: qty 2

## 2022-03-23 MED ORDER — IOHEXOL 300 MG/ML  SOLN
100.0000 mL | Freq: Once | INTRAMUSCULAR | Status: AC | PRN
  Administered 2022-03-23: 100 mL via INTRAVENOUS

## 2022-03-23 MED ORDER — DICYCLOMINE HCL 10 MG/ML IM SOLN
20.0000 mg | Freq: Once | INTRAMUSCULAR | Status: AC
  Administered 2022-03-23: 20 mg via INTRAMUSCULAR
  Filled 2022-03-23: qty 2

## 2022-03-23 MED ORDER — DICYCLOMINE HCL 20 MG PO TABS: 20.0000 mg | ORAL_TABLET | Freq: Two times a day (BID) | ORAL | 0 refills | Status: DC

## 2022-03-23 NOTE — Telephone Encounter (Signed)
Pt wife Manuela Schwartz stated that pt is currently  in ER at [facility] due to severe abdominal pain.  Manuela Schwartz notified that I will follow up with pt tomorrow for a symptom update.

## 2022-03-23 NOTE — ED Triage Notes (Signed)
Pt c/o n/v/d since 10 am with Abd pain. Pt has hx of diverticulitis and was supposed to get a CT today.

## 2022-03-23 NOTE — Telephone Encounter (Signed)
Inbound call from patient, stating he is having severe abdominal pains, patient is inquiring if he should proceed with CT scan. Please advise.

## 2022-03-23 NOTE — Telephone Encounter (Signed)
Please see note below just [facility]

## 2022-03-23 NOTE — Telephone Encounter (Signed)
Pt stated that he recently has been in the ED, and was told to follow up with his GI provider as soon as possible. Pt was scheduled to see Dr. Lyndel Safe on 03/24/2022.  Ct scan completed.

## 2022-03-23 NOTE — Discharge Instructions (Addendum)
Return to the ED with new or worsening signs or symptoms Please follow-up with your GI team.  Please attempt to be seen in the next 1 week. Please follow-up on the results of your stool study done here today on MyChart. Please continue taking Bentyl and Zofran as needed for pain and nausea.  Please follow directions on prescription.

## 2022-03-23 NOTE — ED Provider Notes (Signed)
[facility] Provider Note   CSN: [id number] Arrival date & time: 03/23/22  1154     History  Chief Complaint  Patient presents with   Abdominal Pain    Shaun Boyer is a 47 y.o. male with medical history of GERD, Gilbert's disease, enlarged spleen, diarrhea, diverticulitis, C. difficile.  Patient presents to ED for evaluation of abdominal pain.  Patient reports that he has had off-and-on abdominal pain on the left side of his lower abdomen for about the last month.  The patient reports that his gastroenterology team is aware of this.  Patient states that he was set to have CT scan performed this morning at [facility] as an outpatient.  Patient reports that around 10 AM, he developed sudden onset extreme lower abdominal pain that does not radiate.  Patient reports that the pain is located in his lower abdominal fields, not specifically the left side.  Patient reports that after abdominal pain began he started having nausea and 1 episode of vomiting.  Patient reports that this morning upon waking he had 1 solid and formed stool, 1 stool that was looser in nature and then 1 episode of frank diarrhea.  Patient denies blood in stool.  Patient denies dysuria, fevers, flank pain.  Patient reports that the symptoms he is experiencing right now are very similar to when he had C. difficile.  Patient states that his initial bout of C. difficile was most likely secondary to antibiotics he was taking for diverticulitis.  Patient denies any recent antibiotic usage.   Abdominal Pain Associated symptoms: diarrhea, nausea and vomiting   Associated symptoms: no dysuria and no fever        Home Medications Prior to Admission medications   Medication Sig Start Date End Date Taking? Authorizing Provider  ascorbic acid (VITAMIN C) 500 MG tablet Take 500 mg by mouth daily.   Yes [provider]  cholecalciferol (VITAMIN D3) 25 MCG (1000 UNIT)  tablet Take 1,000 Units by mouth daily.   Yes [provider]  dicyclomine (BENTYL) 20 MG tablet Take 1 tablet (20 mg total) by mouth 2 (two) times daily. 03/23/22  Yes Azucena Cecil, PA-C  loratadine (CLARITIN) 10 MG tablet Take 10 mg by mouth daily.   Yes [provider]  ondansetron (ZOFRAN) 4 MG tablet Take 1 tablet (4 mg total) by mouth every 6 (six) hours as needed for nausea or vomiting. 03/23/22  Yes Azucena Cecil, PA-C  zinc sulfate 220 (50 Zn) MG capsule Take 220 mg by mouth daily.   Yes [provider]      Allergies    Patient has no known allergies.    Review of Systems   Review of Systems  Constitutional:  Negative for fever.  Gastrointestinal:  Positive for abdominal pain, diarrhea, nausea and vomiting. Negative for blood in stool.  Genitourinary:  Negative for dysuria.  All other systems reviewed and are negative.   Physical Exam Updated Vital Signs BP 123/85   Pulse 81   Temp 98 F (36.7 C) (Oral)   Resp 12   Ht '6\' 1"'$  (1.854 m)   Wt 95.3 kg   SpO2 95%   BMI 27.71 kg/m  Physical Exam Vitals and nursing note reviewed.  Constitutional:      General: He is not in acute distress.    Appearance: Normal appearance. He is not ill-appearing, toxic-appearing or diaphoretic.  HENT:     Head:  Normocephalic and atraumatic.     Nose: Nose normal. No congestion.     Mouth/Throat:     Mouth: Mucous membranes are moist.     Pharynx: Oropharynx is clear.  Eyes:     Extraocular Movements: Extraocular movements intact.     Conjunctiva/sclera: Conjunctivae normal.     Pupils: Pupils are equal, round, and reactive to light.  Cardiovascular:     Rate and Rhythm: Normal rate and regular rhythm.  Pulmonary:     Effort: Pulmonary effort is normal.     Breath sounds: Normal breath sounds. No wheezing.  Abdominal:     General: Abdomen is flat.     Tenderness: There is abdominal tenderness in the suprapubic area and left lower quadrant.   Musculoskeletal:     Cervical back: Normal range of motion and neck supple. No tenderness.  Skin:    General: Skin is warm and dry.     Capillary Refill: Capillary refill takes less than 2 seconds.  Neurological:     Mental Status: He is alert and oriented to person, place, and time.     ED Results / Procedures / Treatments   Labs (all labs ordered are listed, but only abnormal results are displayed) Labs Reviewed  COMPREHENSIVE METABOLIC PANEL - Abnormal; Notable for the following components:      Result Value   Glucose, Bld 112 (*)    Total Bilirubin 2.9 (*)    All other components within normal limits  URINALYSIS, ROUTINE W REFLEX MICROSCOPIC - Abnormal; Notable for the following components:   Specific Gravity, Urine 1.044 (*)    Ketones, ur 5 (*)    All other components within normal limits  GASTROINTESTINAL PANEL BY PCR, STOOL (REPLACES STOOL CULTURE)  C DIFFICILE QUICK SCREEN W PCR REFLEX    LIPASE, BLOOD  CBC    EKG None  Radiology CT ABDOMEN PELVIS W CONTRAST  Result Date: 03/23/2022 CLINICAL DATA:  Acute abdominal pain, diverticulitis suspected. EXAM: CT ABDOMEN AND PELVIS WITH CONTRAST TECHNIQUE: Multidetector CT imaging of the abdomen and pelvis was performed using the standard protocol following bolus administration of intravenous contrast. RADIATION DOSE REDUCTION: This exam was performed according to the departmental dose-optimization program which includes automated exposure control, adjustment of the mA and/or kV according to patient size and/or use of iterative reconstruction technique. CONTRAST:  179m OMNIPAQUE IOHEXOL 300 MG/ML  SOLN COMPARISON:  None Available. FINDINGS: Lower chest: No acute abnormality. Hepatobiliary: No focal liver abnormality is seen. Status post cholecystectomy. No biliary dilatation. Pancreas: Unremarkable. No pancreatic ductal dilatation or surrounding inflammatory changes. Spleen: Normal in size without focal abnormality.  Adrenals/Urinary Tract: Adrenal glands are unremarkable. Kidneys are normal, without renal calculi, focal lesion, or hydronephrosis. Bladder is unremarkable. Stomach/Bowel: Stomach is within normal limits. Appendix appears normal. Scattered colonic diverticulosis without evidence of acute diverticulitis no evidence of bowel wall thickening, distention, or inflammatory changes. Vascular/Lymphatic: No significant vascular findings are present. No enlarged abdominal or pelvic lymph nodes. Reproductive: Course prosthetic calcifications Other: No abdominal wall hernia or abnormality. No abdominopelvic ascites. Musculoskeletal: No acute or significant osseous findings. IMPRESSION: 1. No CT evidence of acute abdominal/pelvic process. 2. Scattered colonic diverticulosis without evidence of acute diverticulitis. 3. Status post cholecystectomy. Electronically Signed   By: IKeane PoliceD.O.   On: 03/23/2022 13:40    Procedures Procedures   Medications Ordered in ED Medications  ondansetron (ZOFRAN) injection 4 mg (4 mg Intravenous Given 03/23/22 1239)  dicyclomine (BENTYL) injection 20 mg (20 mg Intramuscular  Given 03/23/22 1242)  fentaNYL (SUBLIMAZE) injection 50 mcg (50 mcg Intravenous Given 03/23/22 1239)  sodium chloride 0.9 % bolus 500 mL (0 mLs Intravenous Stopped 03/23/22 1410)  iohexol (OMNIPAQUE) 300 MG/ML solution 100 mL (100 mLs Intravenous Contrast Given 03/23/22 1254)  fentaNYL (SUBLIMAZE) injection 50 mcg (50 mcg Intravenous Given 03/23/22 1413)    ED Course/ Medical Decision Making/ A&P  Medical Decision Making Amount and/or Complexity of Data Reviewed Labs: ordered. Radiology: ordered.  Risk Prescription drug management.   47 year old male presents to the ED for evaluation.  Please see HPI for further details.  On examination the patient is afebrile, nontachycardic.  Lung sounds are clear bilaterally, he is not hypoxic on room air.  Abdomen is soft and compressible however patient does have  extreme tenderness on the left side of his lower abdominal fields as well as his suprapubic region.  There is no overlying skin change of the patient abdomen.  There is no CVA tenderness bilaterally.  Patient workup include a CBC, CMP, lipase, urinalysis, CT abdomen pelvis.  Will provide patient with Bentyl 20 mg, 50 mcg of fentanyl, 500 mL fluid.  Patient received Zofran in triage.  CBC unremarkable without leukocytosis or anemia.  Patient CMP shows elevated bilirubin at 2.9 however this is consistent with patient baseline, patient has Gilbert's syndrome.  No other electrolyte derangement noted.  Lipase within normal limits.  Urinalysis unremarkable.  Patient denies dysuria.  Patient CT abdomen pelvis does not show any evidence of diverticulitis however there is multiple areas of diverticulosis.  On reevaluation the patient reports that his pain is much reduced after Bentyl and additional 50 mcg fentanyl.  Patient reports that this feels very similar to when he had C. difficile however the patient denies any recent antibiotic usage.  The patient will have a stool sample collected to assess.  The patient will be sent home with Zofran and Bentyl.  The patient be advised to follow-up with his GI team for further management.  The patient was given return precautions and both him and his wife voiced understanding.  The patient had all his questions answered to his satisfaction.  The patient is stable for discharge.   Final Clinical Impression(s) / ED Diagnoses Final diagnoses:  Abdominal pain, unspecified abdominal location    Rx / DC Orders ED Discharge Orders          Ordered    dicyclomine (BENTYL) 20 MG tablet  2 times daily        03/23/22 1424    ondansetron (ZOFRAN) 4 MG tablet  Every 6 hours PRN        03/23/22 1424              Azucena Cecil, PA-C 03/23/22 1424    Carmin Muskrat, MD 03/24/22 [phone]

## 2022-03-24 ENCOUNTER — Encounter: Payer: Self-pay | Admitting: Gastroenterology

## 2022-03-24 ENCOUNTER — Ambulatory Visit: Payer: 59 | Admitting: Gastroenterology

## 2022-03-24 ENCOUNTER — Other Ambulatory Visit: Payer: 59

## 2022-03-24 VITALS — BP 126/78 | HR 74 | Ht 72.0 in | Wt 211.0 lb

## 2022-03-24 DIAGNOSIS — K580 Irritable bowel syndrome with diarrhea: Secondary | ICD-10-CM | POA: Diagnosis not present

## 2022-03-24 DIAGNOSIS — Z8719 Personal history of other diseases of the digestive system: Secondary | ICD-10-CM

## 2022-03-24 DIAGNOSIS — R112 Nausea with vomiting, unspecified: Secondary | ICD-10-CM

## 2022-03-24 LAB — GASTROINTESTINAL PANEL BY PCR, STOOL (REPLACES STOOL CULTURE)

## 2022-03-24 MED ORDER — TRAMADOL HCL 50 MG PO TABS: 50.0000 mg | ORAL_TABLET | Freq: Three times a day (TID) | ORAL | 1 refills | Status: DC | PRN

## 2022-03-24 MED ORDER — CHOLESTYRAMINE 4 G PO PACK: 4.0000 g | PACK | Freq: Every day | ORAL | 2 refills | Status: DC

## 2022-03-24 MED ORDER — HYOSCYAMINE SULFATE 0.125 MG SL SUBL: 0.1250 mg | SUBLINGUAL_TABLET | Freq: Four times a day (QID) | SUBLINGUAL | 5 refills | Status: DC | PRN

## 2022-03-24 MED ORDER — DICYCLOMINE HCL 20 MG PO TABS: 20.0000 mg | ORAL_TABLET | Freq: Three times a day (TID) | ORAL | 2 refills | Status: DC | PRN

## 2022-03-24 NOTE — Progress Notes (Signed)
Chief Complaint:  [facility] ED     Assessment & Plan    #1. N/V. Neg CT AP Apr 19, 2022. Nl CBC, CMP and lipase.  #2. IBS-D.  With postcholecystectomy diarrhea.  Neg stool for GI pathogen, C. Difficile, calprotectin, fecal elastase.  #3. H/O sig diverticulitis June 2022, treated with Augmentin then complicated by C. difficile colitis requiring vancomycin.  Plan: -Celiac serology, alpha gal  -Food diary -EGD with SBbx -Bentyl '20mg'$  po TID #90, 2RF -Cholestryamine 4g po QD- 2 hrs before or after meds -Continue omeprazole 40 QD. -Continue zofran prn -Levsin 0.'125mg'$  Q6hrs prn #120 -Ultram '50mg'$  po Q8 hrs prn Abdo pain #30.       HPI:     Shaun Boyer is a 47 y.o. year old male with a past medical history of GERD, IBS-D, H/O C. difficile colitis, Gilbert's, OSA on CPAP, diverticulitis, cholecystectomy , splenomegaly Accompanied by his wife.  [facility] ED visit 03/23/2022 with intractable Abdo pain, bloating, diarrhea, then N/V Had normal CBC, CMP, lipase. Stool studies were neg for GI pathogens, C. Difficile CT Abdo/pelvis with contrast: neg for diverticulitis or any acute abnormalities. Dx with IBS exacerbation-pain controlled by fentanyl, given Bentyl and Zofran.   Sent to GI clinic for follow-up visit. With continued longstanding abdominal problems including abdominal pain, bloating, intermittent diarrhea.  Had negative colonoscopy 08/2021 except for diverticulosis and small internal hemorrhoids.  Negative random TI and random colon biopsies for IBD/microscopic colitis.  Very distressed with the symptoms.  He used to have constipation when he was taking pain medications in past.   No sodas, chocolates, chewing gums, artificial sweeteners and candy. No NSAIDs     Past GI history:  CT AP with contrast 03/23/2022 1. No CT evidence of acute abdominal/pelvic process. 2. Scattered colonic diverticulosis without evidence of acute diverticulitis. 3. Status post  cholecystectomy.  Colonoscopy 09/15/2021 - Moderate predominantly sigmoid diverticulosis.  - Non-bleeding internal hemorrhoids.  - Otherwise normal colonoscopy to TI. - Negative TI and random colon biopsies - Rpt 10 yrs. earlier, if with any problems.   CT Abdo/pelvis with contrast 08/2021  Mild fluid within the rectosigmoid colon with some mucosal enhancement, question mild proctocolitis. Diverticular disease of the descending and sigmoid colon without convincing evidence for diverticulitis.      CT Abdo/pelvis with contrast 06/2020 Acute sigmoid diverticulitis without abscess or free intraperitoneal air.              Past Medical History:  Diagnosis Date   Diarrhea      predominant irritable syndrome   GERD (gastroesophageal reflux disease)     Gilbert's disease     Obstructive sleep apnea      on CPAP   Spleen enlarged           Past Surgical History:  Procedure Laterality Date   CHOLECYSTECTOMY   2007   WISDOM TOOTH EXTRACTION             Family History  Problem Relation Age of Onset   Heart attack Father 28        multiple stents,passed away in 04/18/08 with multiple complications   Multiple sclerosis Father     Coronary artery disease Maternal Grandmother          bypass surgery times 5 vessel   Coronary artery disease Maternal Uncle          with stents and bypass   Heart attack Paternal Grandmother      Social History  Tobacco Use   Smoking status: Former      Packs/day: 2.00      Years: 10.00      Total pack years: 20.00      Types: Cigarettes      Quit date: 01/19/2008      Years since quitting: 13.6   Smokeless tobacco: Current      Types: Snuff   Tobacco comments:      dip 1.5 cans snuff/day  Substance Use Topics   Alcohol use: No   Drug use: No          Current Outpatient Medications  Medication Sig Dispense Refill   loratadine (CLARITIN) 10 MG tablet Take 10 mg by mouth daily.       omeprazole (PRILOSEC) 40 MG capsule Take 40  mg by mouth daily.       ondansetron (ZOFRAN) 4 MG tablet Take 1 tablet (4 mg total) by mouth every 8 (eight) hours as needed for nausea or vomiting. 12 tablet 0   propranolol ER (INDERAL LA) 60 MG 24 hr capsule Take 60 mg by mouth daily.       clonazePAM (KLONOPIN) 0.5 MG tablet Take 1 tablet by mouth 3 (three) times daily as needed.  (Patient not taking: Reported on 09/03/2021)       naproxen (NAPROSYN) 500 MG tablet  (Patient not taking: Reported on 09/03/2021)       oxyCODONE-acetaminophen (PERCOCET/ROXICET) 5-325 MG tablet Take 1 tablet by mouth every 4 (four) hours as needed for severe pain. (Patient not taking: Reported on 09/03/2021) 15 tablet 0   sertraline (ZOLOFT) 50 MG tablet Take 1 tablet by mouth Daily. (Patient not taking: Reported on 09/03/2021)       traMADol (ULTRAM) 50 MG tablet Take 1 tablet (50 mg total) by mouth every 6 (six) hours as needed. (Patient not taking: Reported on 09/03/2021) 30 tablet 0    No current facility-administered medications for this visit.    No Known Allergies     Review of Systems:  All systems reviewed and negative except where noted in HPI.       Wt Readings from Last 3 Encounters:  04/06/18 230 lb (104.3 kg)  03/23/18 230 lb (104.3 kg)  03/09/18 230 lb (104.3 kg)      Physical Exam    BP 114/86   Pulse 85   Ht '6\' 1"'$  (1.854 m)   Wt 223 lb (101.2 kg)   BMI 29.42 kg/m  Constitutional:  Generally well appearing male in no acute distress. Psychiatric: Pleasant. Normal mood and affect. Behavior is normal. EENT: Pupils normal.  Conjunctivae are normal. No scleral icterus. Neck supple.  Cardiovascular: Normal rate, regular rhythm. No edema Pulmonary/chest: Effort normal and breath sounds normal. No wheezing, rales or rhonchi. Abdominal: Soft, moderate left mid abdominal tenderness.  Bowel sounds active throughout. There are no masses palpable. No hepatomegaly. Neurological: Alert and oriented to person place and time. Skin: Skin is warm and  dry. No rashes noted.      Latest Ref Rng & Units 03/23/2022   12:02 PM 03/19/2022   12:28 PM 10/19/2021    2:32 PM  CBC  WBC 4.0 - 10.5 K/uL 5.2  4.8  6.7   Hemoglobin 13.0 - 17.0 g/dL 16.3  16.3  15.5   Hematocrit 39.0 - 52.0 % 47.0  46.3  45.3   Platelets 150 - 400 K/uL 252  224.0  284.0       Latest Ref Rng & Units 03/23/2022  12:02 PM 03/19/2022   12:28 PM 10/19/2021    2:32 PM  CMP  Glucose 70 - 99 mg/dL 112  91  83   BUN 6 - 20 mg/dL '18  17  18   '$ Creatinine 0.61 - 1.24 mg/dL 1.17  1.17  1.34   Sodium 135 - 145 mmol/L 141  139  139   Potassium 3.5 - 5.1 mmol/L 4.1  4.1  4.3   Chloride 98 - 111 mmol/L 103  103  103   CO2 22 - 32 mmol/L '24  28  29   '$ Calcium 8.9 - 10.3 mg/dL 9.6  9.7  9.8   Total Protein 6.5 - 8.1 g/dL 7.4  6.9    Total Bilirubin 0.3 - 1.2 mg/dL 2.9  3.0    Alkaline Phos 38 - 126 U/L 55  53    AST 15 - 41 U/L 18  13    ALT 0 - 44 U/L 24  20     Lipase     Component Value Date/Time   LIPASE 34 03/23/2022 [place], MD [facility] [phone]

## 2022-03-24 NOTE — Telephone Encounter (Addendum)
Chart reviewed. Pt has appointment with Dr. Lyndel Safe today.

## 2022-03-24 NOTE — Patient Instructions (Addendum)
_______________________________________________________  If your blood pressure at your visit was 140/90 or greater, please contact your primary care physician to follow up on this.  _______________________________________________________  If you are age 47 or older, your body mass index should be between 23-30. Your Body mass index is 28.62 kg/m. If this is out of the aforementioned range listed, please consider follow up with your Primary Care Provider.  If you are age 11 or younger, your body mass index should be between 19-25. Your Body mass index is 28.62 kg/m. If this is out of the aformentioned range listed, please consider follow up with your Primary Care Provider.   ________________________________________________________  The [place] [facility] providers would like to encourage you to use [facility] to communicate with providers for non-urgent requests or questions.  Due to long hold times on the telephone, sending your provider a message by [facility] may be a faster and more efficient way to get a response.  Please allow 48 business hours for a response.  Please remember that this is for non-urgent requests.  _______________________________________________________  The [place] [facility] providers would like to encourage you to use [facility] to communicate with providers for non-urgent requests or questions.  Due to long hold times on the telephone, sending your provider a message by [facility] may be a faster and more efficient way to get a response.  Please allow 48 business hours for a response.  Please remember that this is for non-urgent requests.  _______________________________________________________  Your provider has requested that you go to the basement level for lab work before leaving today. Press "B" on the elevator. The lab is located at the first door on the left as you exit the elevator.  Keep a food diary  We have sent the following medications to your pharmacy for you to pick up at your  convenience: Bentyl '20mg'$  TID Questran 1 packet daily take 2 hours before or after all other medications Levsin 0.'125mg'$  every 6 hours as needed Ultram '50mg'$  every 8 hours as needed a script has been given to you  Continue  zofran as needed   You have been scheduled for an endoscopy. Please follow written instructions given to you at your visit today. If you use inhalers (even only as needed), please bring them with you on the day of your procedure.   Thank you,  Dr. [name]

## 2022-03-25 ENCOUNTER — Encounter: Payer: Self-pay | Admitting: Gastroenterology

## 2022-03-25 ENCOUNTER — Ambulatory Visit (AMBULATORY_SURGERY_CENTER): Payer: 59 | Admitting: Gastroenterology

## 2022-03-25 VITALS — BP 101/67 | HR 72 | Temp 97.1°F | Resp 16 | Ht 72.0 in | Wt 211.0 lb

## 2022-03-25 DIAGNOSIS — K209 Esophagitis, unspecified without bleeding: Secondary | ICD-10-CM | POA: Diagnosis not present

## 2022-03-25 DIAGNOSIS — K319 Disease of stomach and duodenum, unspecified: Secondary | ICD-10-CM | POA: Diagnosis not present

## 2022-03-25 DIAGNOSIS — K2100 Gastro-esophageal reflux disease with esophagitis, without bleeding: Secondary | ICD-10-CM | POA: Diagnosis present

## 2022-03-25 DIAGNOSIS — K2950 Unspecified chronic gastritis without bleeding: Secondary | ICD-10-CM | POA: Diagnosis not present

## 2022-03-25 DIAGNOSIS — R112 Nausea with vomiting, unspecified: Secondary | ICD-10-CM

## 2022-03-25 DIAGNOSIS — K229 Disease of esophagus, unspecified: Secondary | ICD-10-CM | POA: Diagnosis not present

## 2022-03-25 MED ORDER — SODIUM CHLORIDE 0.9 % IV SOLN: 500.0000 mL | Freq: Once | INTRAVENOUS | Status: AC

## 2022-03-25 MED ORDER — PANTOPRAZOLE SODIUM 40 MG PO TBEC: 40.0000 mg | DELAYED_RELEASE_TABLET | Freq: Two times a day (BID) | ORAL | 6 refills | Status: DC

## 2022-03-25 NOTE — Progress Notes (Signed)
Chief Complaint:  [facility]     Assessment & Plan    #1. N/V. Neg CT AP 04/20/22. Nl CBC, CMP and lipase.  #2. IBS-D.  With postcholecystectomy diarrhea.  Neg stool for GI pathogen, C. Difficile, calprotectin, fecal elastase.  #3. H/O sig diverticulitis June 2022, treated with Augmentin then complicated by C. difficile colitis requiring vancomycin.  Plan: -Celiac serology, alpha gal  -Food diary -EGD with SBbx -Bentyl '20mg'$  po TID #90, 2RF -Cholestryamine 4g po QD- 2 hrs before or after meds -Continue omeprazole 40 QD. -Continue zofran prn -Levsin 0.'125mg'$  Q6hrs prn #120 -Ultram '50mg'$  po Q8 hrs prn Abdo pain #30.       HPI:     Shaun Boyer is a 47 y.o. year old male with a past medical history of GERD, IBS-D, H/O C. difficile colitis, Gilbert's, OSA on CPAP, diverticulitis, cholecystectomy , splenomegaly Accompanied by his wife.  [facility] ED visit 03/23/2022 with intractable Abdo pain, bloating, diarrhea, then N/V Had normal CBC, CMP, lipase. Stool studies were neg for GI pathogens, C. Difficile CT Abdo/pelvis with contrast: neg for diverticulitis or any acute abnormalities. Dx with IBS exacerbation-pain controlled by fentanyl, given Bentyl and Zofran.   Sent to GI clinic for follow-up visit. With continued longstanding abdominal problems including abdominal pain, bloating, intermittent diarrhea.  Had negative colonoscopy 08/2021 except for diverticulosis and small internal hemorrhoids.  Negative random TI and random colon biopsies for IBD/microscopic colitis.  Very distressed with the symptoms.  He used to have constipation when he was taking pain medications in past.   No sodas, chocolates, chewing gums, artificial sweeteners and candy. No NSAIDs     Past GI history:  CT AP with contrast 03/23/2022 1. No CT evidence of acute abdominal/pelvic process. 2. Scattered colonic diverticulosis without evidence of acute diverticulitis. 3. Status post  cholecystectomy.  Colonoscopy 09/15/2021 - Moderate predominantly sigmoid diverticulosis.  - Non-bleeding internal hemorrhoids.  - Otherwise normal colonoscopy to TI. - Negative TI and random colon biopsies - Rpt 10 yrs. earlier, if with any problems.   CT Abdo/pelvis with contrast 08/2021  Mild fluid within the rectosigmoid colon with some mucosal enhancement, question mild proctocolitis. Diverticular disease of the descending and sigmoid colon without convincing evidence for diverticulitis.      CT Abdo/pelvis with contrast 06/2020 Acute sigmoid diverticulitis without abscess or free intraperitoneal air.              Past Medical History:  Diagnosis Date   Diarrhea      predominant irritable syndrome   GERD (gastroesophageal reflux disease)     Gilbert's disease     Obstructive sleep apnea      on CPAP   Spleen enlarged           Past Surgical History:  Procedure Laterality Date   CHOLECYSTECTOMY   2007   WISDOM TOOTH EXTRACTION             Family History  Problem Relation Age of Onset   Heart attack Father 25        multiple stents,passed away in 19-Apr-2008 with multiple complications   Multiple sclerosis Father     Coronary artery disease Maternal Grandmother          bypass surgery times 5 vessel   Coronary artery disease Maternal Uncle          with stents and bypass   Heart attack Paternal Grandmother      Social History  Tobacco Use   Smoking status: Former      Packs/day: 2.00      Years: 10.00      Total pack years: 20.00      Types: Cigarettes      Quit date: 01/19/2008      Years since quitting: 13.6   Smokeless tobacco: Current      Types: Snuff   Tobacco comments:      dip 1.5 cans snuff/day  Substance Use Topics   Alcohol use: No   Drug use: No          Current Outpatient Medications  Medication Sig Dispense Refill   loratadine (CLARITIN) 10 MG tablet Take 10 mg by mouth daily.       omeprazole (PRILOSEC) 40 MG capsule Take 40  mg by mouth daily.       ondansetron (ZOFRAN) 4 MG tablet Take 1 tablet (4 mg total) by mouth every 8 (eight) hours as needed for nausea or vomiting. 12 tablet 0   propranolol ER (INDERAL LA) 60 MG 24 hr capsule Take 60 mg by mouth daily.       clonazePAM (KLONOPIN) 0.5 MG tablet Take 1 tablet by mouth 3 (three) times daily as needed.  (Patient not taking: Reported on 09/03/2021)       naproxen (NAPROSYN) 500 MG tablet  (Patient not taking: Reported on 09/03/2021)       oxyCODONE-acetaminophen (PERCOCET/ROXICET) 5-325 MG tablet Take 1 tablet by mouth every 4 (four) hours as needed for severe pain. (Patient not taking: Reported on 09/03/2021) 15 tablet 0   sertraline (ZOLOFT) 50 MG tablet Take 1 tablet by mouth Daily. (Patient not taking: Reported on 09/03/2021)       traMADol (ULTRAM) 50 MG tablet Take 1 tablet (50 mg total) by mouth every 6 (six) hours as needed. (Patient not taking: Reported on 09/03/2021) 30 tablet 0    No current facility-administered medications for this visit.    No Known Allergies     Review of Systems:  All systems reviewed and negative except where noted in HPI.       Wt Readings from Last 3 Encounters:  04/06/18 230 lb (104.3 kg)  03/23/18 230 lb (104.3 kg)  03/09/18 230 lb (104.3 kg)      Physical Exam    BP 114/86   Pulse 85   Ht '6\' 1"'$  (1.854 m)   Wt 223 lb (101.2 kg)   BMI 29.42 kg/m  Constitutional:  Generally well appearing male in no acute distress. Psychiatric: Pleasant. Normal mood and affect. Behavior is normal. EENT: Pupils normal.  Conjunctivae are normal. No scleral icterus. Neck supple.  Cardiovascular: Normal rate, regular rhythm. No edema Pulmonary/chest: Effort normal and breath sounds normal. No wheezing, rales or rhonchi. Abdominal: Soft, moderate left mid abdominal tenderness.  Bowel sounds active throughout. There are no masses palpable. No hepatomegaly. Neurological: Alert and oriented to person place and time. Skin: Skin is warm and  dry. No rashes noted.      Latest Ref Rng & Units 03/23/2022   12:02 PM 03/19/2022   12:28 PM 10/19/2021    2:32 PM  CBC  WBC 4.0 - 10.5 K/uL 5.2  4.8  6.7   Hemoglobin 13.0 - 17.0 g/dL 16.3  16.3  15.5   Hematocrit 39.0 - 52.0 % 47.0  46.3  45.3   Platelets 150 - 400 K/uL 252  224.0  284.0       Latest Ref Rng & Units 03/23/2022  12:02 PM 03/19/2022   12:28 PM 10/19/2021    2:32 PM  CMP  Glucose 70 - 99 mg/dL 112  91  83   BUN 6 - 20 mg/dL '18  17  18   '$ Creatinine 0.61 - 1.24 mg/dL 1.17  1.17  1.34   Sodium 135 - 145 mmol/L 141  139  139   Potassium 3.5 - 5.1 mmol/L 4.1  4.1  4.3   Chloride 98 - 111 mmol/L 103  103  103   CO2 22 - 32 mmol/L '24  28  29   '$ Calcium 8.9 - 10.3 mg/dL 9.6  9.7  9.8   Total Protein 6.5 - 8.1 g/dL 7.4  6.9    Total Bilirubin 0.3 - 1.2 mg/dL 2.9  3.0    Alkaline Phos 38 - 126 U/L 55  53    AST 15 - 41 U/L 18  13    ALT 0 - 44 U/L 24  20     Lipase     Component Value Date/Time   LIPASE 34 03/23/2022 [place], MD [facility] [phone]

## 2022-03-25 NOTE — Progress Notes (Signed)
VS completed by [place].   Pt's states no medical or surgical changes since previsit or office visit.  

## 2022-03-25 NOTE — Progress Notes (Signed)
Called to room to assist during endoscopic procedure.  Patient ID and intended procedure confirmed with present staff. Received instructions for my participation in the procedure from the performing physician.  

## 2022-03-25 NOTE — Patient Instructions (Signed)
Information on gastritis and esophagitis given to you today.  Resume previous diet.  Use Protonix (pantoprazole) 40 mg twice a day times 8 weeks, then every day.  Stop Omeprazole.  Avoid NSAIDS (Aspirin, Ibuprofen, Aleve, Naproxen), you may use Tylenol as needed.  Await pathology results from the biopsies taken today.    YOU HAD AN ENDOSCOPIC PROCEDURE TODAY AT [facility]:   Refer to the procedure report that was given to you for any specific questions about what was found during the examination.  If the procedure report does not answer your questions, please call your gastroenterologist to clarify.  If you requested that your care partner not be given the details of your procedure findings, then the procedure report has been included in a sealed envelope for you to review at your convenience later.  YOU SHOULD EXPECT: Some feelings of bloating in the abdomen. Passage of more gas than usual.  Walking can help get rid of the air that was put into your GI tract during the procedure and reduce the bloating. If you had a lower endoscopy (such as a colonoscopy or flexible sigmoidoscopy) you may notice spotting of blood in your stool or on the toilet paper. If you underwent a bowel prep for your procedure, you may not have a normal bowel movement for a few days.  Please Note:  You might notice some irritation and congestion in your nose or some drainage.  This is from the oxygen used during your procedure.  There is no need for concern and it should clear up in a day or so.  SYMPTOMS TO REPORT IMMEDIATELY:  Following upper endoscopy (EGD)  Vomiting of blood or coffee ground material  New chest pain or pain under the shoulder blades  Painful or persistently difficult swallowing  New shortness of breath  Fever of 100F or higher  Black, tarry-looking stools  For urgent or emergent issues, a gastroenterologist can be reached at any hour by calling [phone]. Do not use  MyChart messaging for urgent concerns.    DIET:  We do recommend a small meal at first, but then you may proceed to your regular diet.  Drink plenty of fluids but you should avoid alcoholic beverages for 24 hours.  ACTIVITY:  You should plan to take it easy for the rest of today and you should NOT DRIVE or use heavy machinery until tomorrow (because of the sedation medicines used during the test).    FOLLOW UP: Our staff will call the number listed on your records the next business day following your procedure.  We will call around 7:15- 8:00 am to check on you and address any questions or concerns that you may have regarding the information given to you following your procedure. If we do not reach you, we will leave a message.     If any biopsies were taken you will be contacted by phone or by letter within the next 1-3 weeks.  Please call us at [phone] if you have not heard about the biopsies in 3 weeks.    SIGNATURES/CONFIDENTIALITY: You and/or your care partner have signed paperwork which will be entered into your electronic medical record.  These signatures attest to the fact that that the information above on your After Visit Summary has been reviewed and is understood.  Full responsibility of the confidentiality of this discharge information lies with you and/or your care-partner.

## 2022-03-25 NOTE — Op Note (Signed)
[place] Patient Name: Shaun Boyer Procedure Date: 03/25/2022 2:22 PM MRN: [id number] Endoscopist: [name] , MD, [id number] Age: 47 Referring MD:  Date of Birth: 07-12-1975 Gender: Male Account #: [id number] Procedure:                Upper GI endoscopy Indications:              N/V. Neg CT AP 03/2022. Nl CBC, CMP and lipase. Medicines:                Monitored Anesthesia Care Procedure:                Pre-Anesthesia Assessment:                           - Prior to the procedure, a History and Physical                            was performed, and patient medications and                            allergies were reviewed. The patient's tolerance of                            previous anesthesia was also reviewed. The risks                            and benefits of the procedure and the sedation                            options and risks were discussed with the patient.                            All questions were answered, and informed consent                            was obtained. Prior Anticoagulants: The patient has                            taken no anticoagulant or antiplatelet agents. ASA                            Grade Assessment: II - A patient with mild systemic                            disease. After reviewing the risks and benefits,                            the patient was deemed in satisfactory condition to                            undergo the procedure.                           After obtaining informed consent, the endoscope was  passed under direct vision. Throughout the                            procedure, the patient's blood pressure, pulse, and                            oxygen saturations were monitored continuously. The                            GIF [id number] was introduced through the                            mouth, and advanced to the second part of duodenum.                            The upper GI  endoscopy was accomplished without                            difficulty. The patient tolerated the procedure                            well. Scope In: Scope Out: Findings:                 LA Grade C (one or more mucosal breaks continuous                            between tops of 2 or more mucosal folds, less than                            75% circumference) esophagitis with no bleeding was                            found 38 to 40 cm from the incisors. Biopsies were                            taken with a cold forceps for histology. Estimated                            blood loss: none. Multiple biopsies were also                            obtained from proximal and midesophagus to rule out                            EoE.                           Localized moderate inflammation characterized by                            erosions and erythema was found in the gastric  antrum. Biopsies were taken with a cold forceps for                            histology.                           The examined duodenum was normal. Biopsies for                            histology were taken with a cold forceps for                            evaluation of celiac disease. Complications:            No immediate complications. Estimated Blood Loss:     Estimated blood loss: none. Impression:               - LA Grade C reflux esophagitis with no bleeding.                            Biopsied.                           - Gastritis. Biopsied.                           - Normal examined duodenum. Biopsied. Recommendation:           - Patient has a contact number available for                            emergencies. The signs and symptoms of potential                            delayed complications were discussed with the                            patient. Return to normal activities tomorrow.                            Written discharge instructions were provided to the                             patient.                           - Resume previous diet.                           - Use Protonix (pantoprazole) 40 mg PO BID x 8                            weeks, the QD #60, 6 RF                           - Stop omeprazole.                           -  Avoid ibuprofen, naproxen, or other non-steroidal                            anti-inflammatory drugs.                           - Await pathology results.                           - The findings and recommendations were discussed                            with the patient's family. [name], MD 03/25/2022 2:50:00 PM This report has been signed electronically.

## 2022-03-25 NOTE — Progress Notes (Signed)
Vss nad trans to pacu °

## 2022-03-26 ENCOUNTER — Telehealth: Payer: Self-pay | Admitting: *Deleted

## 2022-03-26 LAB — CELIAC PANEL 10
Antigliadin Abs, IgA: 6 units (ref 0–19)
Endomysial IgA: NEGATIVE
Gliadin IgG: 5 units (ref 0–19)
IgA/Immunoglobulin A, Serum: 217 mg/dL (ref 90–386)
Tissue Transglut Ab: 4 U/mL (ref 0–5)
Transglutaminase IgA: <2 U/mL (ref 0–3)

## 2022-03-26 NOTE — Telephone Encounter (Signed)
Attempted to call patient for their post-procedure follow-up call. No answer. Left voicemail.   

## 2022-03-30 LAB — ALPHA-GAL PANEL
Allergen, Mutton, f88: 0.24 kU/L — ABNORMAL HIGH
Allergen, Pork, f26: 0.36 kU/L — ABNORMAL HIGH
Beef: 0.39 kU/L — ABNORMAL HIGH
CLASS: 1
CLASS: 1
GALACTOSE-ALPHA-1,3-GALACTOSE IGE*: 0.29 kU/L — ABNORMAL HIGH (ref <0.10)

## 2022-03-30 LAB — INTERPRETATION:

## 2022-03-31 NOTE — Progress Notes (Signed)
Please inform the patient. STOP all red meats Appt with Allergy docs Send report to family physician

## 2022-04-01 ENCOUNTER — Other Ambulatory Visit: Payer: Self-pay

## 2022-04-01 ENCOUNTER — Encounter: Payer: Self-pay | Admitting: Gastroenterology

## 2022-04-01 ENCOUNTER — Telehealth: Payer: Self-pay

## 2022-04-01 DIAGNOSIS — Z91018 Allergy to other foods: Secondary | ICD-10-CM

## 2022-04-01 NOTE — Telephone Encounter (Signed)
Spoke with patient and sent him a referral and he is aware

## 2022-04-12 NOTE — Progress Notes (Unsigned)
New Patient Note  RE: Shaun Boyer MRN: [id number] DOB: 01/18/1976 Date of Office Visit: 04/13/2022  Consult requested by: Shaun Denmark, MD Primary care provider: [name], MD  Chief Complaint: Other (Alpha gal consult only pt states been having stomach issues.)  History of Present Illness: I had the pleasure of seeing Shaun Boyer for initial evaluation at the [facility] on 04/13/2022. He is a 47 y.o. male, who is referred here by Dr. Jackquline Boyer (GI) for the evaluation of alpha gal allergy.  He reports food allergy to alpha-gal that was found on recent bloodwork.  Patient had IBS issues for many years and follows with GI for this and diverticulitis and GERD.  4-5 years ago was diagnosed with diverticulitis. At the end of last year he had 3 flares of diverticulitis which required antibiotics and then he developed C-diff.  In January patient had some left sided abdominal pain and patient thought his c-diff was coming back. He had all types of bloodwork, imagining, stool studies which was all negative.  Saw GI and had EGD done which showed gastritis. Negative to celiac's.  He recently stopped his PPI as it was causing muscle aches.   Patient eliminated red meat from his diet and noticed improvement in symptoms.  Patient was eating red meat daily prior to this and didn't notice any immediate symptoms.  Denies any associated rash/itching.  He would like to go back to eating red meat as his main source of protein is [place] and some chicken. He doesn't feel like he is getting full from this type of diet.   No recent tick bites that patient is aware of but he does work outdoors.   Mayonnaise causes vomiting but tolerates eggs by itself.  Avoiding peanuts, tree nuts, sesame due to diverticulitis.  Patient does not like finned fish.   Dietary History: patient has been eating other foods including limited cheese, eggs, shellfish, soy, wheat, meats (chicken,  [place]), fruits and vegetables.  On 03/23/2022 patient was taken to the ER due to severe abdominal pain.  Patient had bacon and eggs the morning when this occurred and actually threw up which he usually doesn't do.    Component     Latest Ref Rng 03/24/2022  Beef     kU/L 0.39 (H)   Class 1   Class 1   Allergen, Mutton, f88     kU/L 0.24 (H)   Class 0/1   Allergen, Pork, f26     kU/L 0.36 (H)   GALACTOSE-ALPHA-1,3-GALACTOSE IGE*     <0.10 kU/L 0.29 (H)      03/24/2022 GI visit: "#1. N/V. Neg CT AP 03/2022. Nl CBC, CMP and lipase.   #2. IBS-D.  With postcholecystectomy diarrhea.  Neg stool for GI pathogen, C. Difficile, calprotectin, fecal elastase.   #3. H/O sig diverticulitis June 2022, treated with Augmentin then complicated by C. difficile colitis requiring vancomycin.   Plan: -Celiac serology, alpha gal  -Food diary -EGD with SBbx -Bentyl 20mg  po TID #90, 2RF -Cholestryamine 4g po QD- 2 hrs before or after meds -Continue omeprazole 40 QD. -Continue zofran prn -Levsin 0.125mg  Q6hrs prn #120 -Ultram 50mg  po Q8 hrs prn Abdo pain #30."  03/25/2022 EGD pathology: "1. Surgical [P], duodenal bulb REACTIVE DUODENAL MUCOSA NEGATIVE FOR INTRAEPITHELIAL LYMPHOCYTOSIS 2. Surgical [P], gastric antrum and gastric body REACTIVE GASTROPATHY WITH MINIMAL CHRONIC GASTRITIS NEGATIVE FOR H. PYLORI, INTESTINAL METAPLASIA, DYSPLASIA AND CARCINOMA 3. Surgical [P], distal esophagus REACTIVE  SQUAMOUS MUCOSA WITH FOCAL ACUTE INFLAMMATION AND CHANGES COMPATIBLE WITH REFLUX ESOPHAGITIS MILD CHRONIC GASTRITIS WITH FOVEOLAR HYPERPLASIA NEGATIVE FOR INTESTINAL METAPLASIA, DYSPLASIA AND CARCINOMA 4. Surgical [P], mid esophagus and proximal esophagus REACTIVE SQUAMOUS MUCOSA WITH CHANGES COMPATIBLE WITH REFLUX ESOPHAGITIS NEGATIVE FOR GLANDULAR EPITHELIUM, EOSINOPHILS, DYSPLASIA AND CARCINOMA"  Assessment and Plan: [name] is a 47 y.o. male with: Gastrointestinal complaints Follows with GI for IBS,  diverticulitis, GERD. Patient had recent bout of C diff and having a lot of GI symptoms. EGD showed gastritis but stopped PPI due to muscle aches. Negative Celiac's. Bloodwork was borderline positive to alpha-gal. Patient was eating red meat daily. Eliminated from his diet for 1 week and feeling better. Denies any immediate symptoms, rash or hives. Mainly GI issues which he can't recall if it was from the red meat or his underlying other GI issues. Patient would like to go back to eating red meat. Denies recent known tick bites.  Unable to skin test today as [facility] won't allow new patient visits and procedures on the same day.  Given clinical history and low borderline positive to alpha-gal, I'm not convinced that he has alpha-gal allergy contributing to his GI symptoms.  Return for food skin testing (select common foods, pork, beef, lamb). Discussed that skin testing checks for IgE mediated reactions and his symptoms do not seem to be IgE mediated. He may have some difficulty digesting red meat which is not picked up by skin testing.  If negative will do food challenge on the same day to either hamburger or pork sausage patties.  Advised patient to not change his diet and to continue to avoid all red/mammalian meat.  Keep a journal of foods eaten and symptoms. Follow up with GI regarding what medication to take for the gastritis. Food challenge instructions: You must be off antihistamines for 3-5 days before. Must be in good health and not ill. No vaccines/injections/antibiotics within the past 7 days. Plan on being in the office for 2-3 hours and must bring in the food you want to do the oral challenge for - 1 fully cooked hamburger patty OR 2 sausage patties. You must call to schedule an appointment and specify it's for a food challenge.   Return for Food challenge, Skin testing.  No orders of the defined types were placed in this encounter.  Lab Orders  No laboratory test(s) ordered today     Other allergy screening: Asthma: no Rhino conjunctivitis: yes Mild rhinitis symptoms in the spring and fall and takes Claritin with good benefit.  Medication allergy: no Hymenoptera allergy: no Urticaria: no Eczema:no History of recurrent infections suggestive of immunodeficency: no  Diagnostics: Skin Testing: Unable to skin test today as [facility] won't allow new patient visits and procedures on the same day.    Past Medical History: Patient Active Problem List   Diagnosis Date Noted   Gastrointestinal complaints 04/13/2022   C. difficile diarrhea 10/19/2021   Tobacco use 02/16/2018   Chest pain 07/07/2010   Past Medical History:  Diagnosis Date   Diarrhea    predominant irritable syndrome   GERD (gastroesophageal reflux disease)    Gilbert's disease    Obstructive sleep apnea    on CPAP   Sleep apnea    Spleen enlarged    Past Surgical History: Past Surgical History:  Procedure Laterality Date   CHOLECYSTECTOMY  01/18/2005   COLONOSCOPY     2003   COLONOSCOPY     09/15/2021   WISDOM TOOTH EXTRACTION  Medication List:  Current Outpatient Medications  Medication Sig Dispense Refill   cholecalciferol (VITAMIN D3) 25 MCG (1000 UNIT) tablet Take 1,000 Units by mouth daily.     dicyclomine (BENTYL) 20 MG tablet Take 1 tablet (20 mg total) by mouth 2 (two) times daily. 20 tablet 0   dicyclomine (BENTYL) 20 MG tablet Take 1 tablet (20 mg total) by mouth 3 (three) times daily as needed for spasms. 90 tablet 2   hyoscyamine (LEVSIN SL) 0.125 MG SL tablet Place 1 tablet (0.125 mg total) under the tongue every 6 (six) hours as needed. 120 tablet 5   loratadine (CLARITIN) 10 MG tablet Take 10 mg by mouth daily.     zinc sulfate 220 (50 Zn) MG capsule Take 220 mg by mouth daily.     Current Facility-Administered Medications  Medication Dose Route Frequency Provider Last Rate Last Admin   0.9 %  sodium chloride infusion  500 mL Intravenous Once Shaun Denmark, MD        Allergies: No Known Allergies Social History: Social History   Socioeconomic History   Marital status: Married    Spouse name: Not on file   Number of children: Not on file   Years of education: Not on file   Highest education level: Not on file  Occupational History   Not on file  Tobacco Use   Smoking status: Former    Packs/day: 2.00    Years: 10.00    Additional pack years: 0.00    Total pack years: 20.00    Types: Cigarettes    Quit date: 01/19/2008    Years since quitting: 14.2   Smokeless tobacco: Current    Types: Snuff   Tobacco comments:    dip 1.5 cans snuff/day  Vaping Use   Vaping Use: Never used  Substance and Sexual Activity   Alcohol use: No   Drug use: No   Sexual activity: Not on file  Other Topics Concern   Not on file  Social History Narrative   Married with 3 sons ages 74,6,and 2   Employed at [employer]   Has 3 brothers and 2 sisters who are overall in good health   Social Determinants of Health   Financial Resource Strain: Not on file  Food Insecurity: Not on file  Transportation Needs: Not on file  Physical Activity: Not on file  Stress: Not on file  Social Connections: Not on file   Lives in a house. Smoking: dips Occupation: Agricultural consultant for [employer]  Environmental HistoryFreight forwarder in the house: no Charity fundraiser in the family room: no Carpet in the bedroom: yes Heating: gas Cooling: central Pet: yes 5 dogs   Family History: Family History  Problem Relation Age of Onset   Diabetes Mother    Heart attack Father 7       multiple stents,passed away in 2008-04-25 with multiple complications   Multiple sclerosis Father    Diabetes Brother    Heart attack Brother    Diverticulitis Brother    Diverticulosis Brother    Coronary artery disease Maternal Uncle        with stents and bypass   Coronary artery disease Maternal Grandmother        bypass surgery times 5 vessel   Heart attack Paternal  Grandmother    Diverticulitis Paternal Grandmother    Colon cancer Neg Hx    Esophageal cancer Neg Hx    Pancreatic cancer Neg Hx    Stomach cancer  Neg Hx    Problem                               Relation Asthma                                   no Eczema                                no Food allergy                          no Allergic rhino conjunctivitis     son   Review of Systems  Constitutional:  Positive for unexpected weight change (due to his recent GI issues). Negative for appetite change, chills and fever.  HENT:  Negative for congestion and rhinorrhea.   Eyes:  Negative for itching.  Respiratory:  Negative for cough, chest tightness, shortness of breath and wheezing.   Cardiovascular:  Negative for chest pain.  Gastrointestinal:  Positive for abdominal pain and diarrhea. Negative for constipation, nausea and vomiting.  Genitourinary:  Negative for difficulty urinating.  Skin:  Negative for rash.  Neurological:  Negative for headaches.    Objective: BP 118/70   Pulse 91   Temp 98.1 F (36.7 C)   Resp 20   Ht 6\' 1"  (1.854 m)   Wt 213 lb (96.6 kg)   SpO2 97%   BMI 28.10 kg/m  Body mass index is 28.1 kg/m. Physical Exam Vitals and nursing note reviewed.  Constitutional:      Appearance: Normal appearance. He is well-developed.  HENT:     Head: Normocephalic and atraumatic.     Right Ear: Tympanic membrane and external ear normal.     Left Ear: Tympanic membrane and external ear normal.     Nose: Nose normal.     Mouth/Throat:     Mouth: Mucous membranes are moist.     Pharynx: Oropharynx is clear.  Eyes:     Conjunctiva/sclera: Conjunctivae normal.  Cardiovascular:     Rate and Rhythm: Normal rate and regular rhythm.     Heart sounds: Normal heart sounds. No murmur heard.    No friction rub. No gallop.  Pulmonary:     Effort: Pulmonary effort is normal.     Breath sounds: Normal breath sounds. No wheezing, rhonchi or rales.  Musculoskeletal:      Cervical back: Neck supple.  Skin:    General: Skin is warm.     Findings: No rash.  Neurological:     Mental Status: He is alert and oriented to person, place, and time.  Psychiatric:        Behavior: Behavior normal.    The plan was reviewed with the patient/family, and all questions/concerned were addressed.  It was my pleasure to see Shaun Boyer today and participate in his care. Please feel free to contact me with any questions or concerns.  Sincerely,  Rexene Alberts, DO Allergy & Immunology  [facility] office: [place] office: [phone]

## 2022-04-13 ENCOUNTER — Telehealth: Payer: Self-pay | Admitting: Gastroenterology

## 2022-04-13 ENCOUNTER — Encounter: Payer: Self-pay | Admitting: Allergy

## 2022-04-13 ENCOUNTER — Ambulatory Visit: Payer: 59 | Admitting: Allergy

## 2022-04-13 ENCOUNTER — Other Ambulatory Visit: Payer: Self-pay

## 2022-04-13 VITALS — BP 118/70 | HR 91 | Temp 98.1°F | Resp 20 | Ht 73.0 in | Wt 213.0 lb

## 2022-04-13 DIAGNOSIS — Z713 Dietary counseling and surveillance: Secondary | ICD-10-CM | POA: Diagnosis not present

## 2022-04-13 DIAGNOSIS — K219 Gastro-esophageal reflux disease without esophagitis: Secondary | ICD-10-CM | POA: Diagnosis not present

## 2022-04-13 DIAGNOSIS — R198 Other specified symptoms and signs involving the digestive system and abdomen: Secondary | ICD-10-CM | POA: Diagnosis not present

## 2022-04-13 NOTE — Assessment & Plan Note (Addendum)
Follows with GI for IBS, diverticulitis, GERD. Patient had recent bout of C diff and having a lot of GI symptoms. EGD showed gastritis but stopped PPI due to muscle aches. Negative Celiac's. Bloodwork was borderline positive to alpha-gal. Patient was eating red meat daily. Eliminated from his diet for 1 week and feeling better. Denies any immediate symptoms, rash or hives. Mainly GI issues which he can't recall if it was from the red meat or his underlying other GI issues. Patient would like to go back to eating red meat. Denies recent known tick bites.  Unable to skin test today as [facility] won't allow new patient visits and procedures on the same day.  Given clinical history and low borderline positive to alpha-gal, I'm not convinced that he has alpha-gal allergy contributing to his GI symptoms.  Return for food skin testing (select common foods, pork, beef, lamb). Discussed that skin testing checks for IgE mediated reactions and his symptoms do not seem to be IgE mediated. He may have some difficulty digesting red meat which is not picked up by skin testing.  If negative will do food challenge on the same day to either hamburger or pork sausage patties.  Advised patient to not change his diet and to continue to avoid all red/mammalian meat.  Keep a journal of foods eaten and symptoms. Follow up with GI regarding what medication to take for the gastritis. Food challenge instructions: You must be off antihistamines for 3-5 days before. Must be in good health and not ill. No vaccines/injections/antibiotics within the past 7 days. Plan on being in the office for 2-3 hours and must bring in the food you want to do the oral challenge for - 1 fully cooked hamburger patty OR 2 sausage patties. You must call to schedule an appointment and specify it's for a food challenge.

## 2022-04-13 NOTE — Telephone Encounter (Signed)
Inbound call from patient wanted to let Dr.Gupta know that he is not taking patoprazole because he is getting side effects .! Want to speak with a nurse

## 2022-04-13 NOTE — Patient Instructions (Addendum)
Unable to skin test today as [facility] won't allow new patient visits and procedures on the same day.   Return for food skin testing and if negative will do food challenge on the same day.  Do NOT change your diet. Continue to avoid red meat.  Keep a journal of foods eaten and symptoms.  Follow up with GI regarding what medication to take for your gastritis.  Food challenge instructions: You must be off antihistamines for 3-5 days before. Must be in good health and not ill. No vaccines/injections/antibiotics within the past 7 days. Plan on being in the office for 2-3 hours and must bring in the food you want to do the oral challenge for - 1 fully cooked hamburger patty OR 2 sausage patties. You must call to schedule an appointment and specify it's for a food challenge.

## 2022-04-13 NOTE — Telephone Encounter (Signed)
Patient returning call. Please advise

## 2022-04-13 NOTE — Telephone Encounter (Signed)
Left message for pt to call back  °

## 2022-04-14 NOTE — Telephone Encounter (Signed)
PT returning call

## 2022-04-14 NOTE — Telephone Encounter (Signed)
Left message for pt to call back  °

## 2022-04-14 NOTE — Telephone Encounter (Signed)
Lets switch back to omeprazole-same dose as before RG

## 2022-04-14 NOTE — Telephone Encounter (Signed)
Pt stated that he was recently switched from the Omeprazole to the Pantoprazole after his recent procedure on 03/25/2022. Pt stated that after switching the medication he started to have aching from his lower back to his feet. Pt stated that he researched the side effect of the pantoprazole and noted that joint pain was one of the side effects. Pt stated that he stopped taking the protonix 3 days ago and the aching is feeling better. Please advise .

## 2022-04-15 ENCOUNTER — Other Ambulatory Visit: Payer: Self-pay

## 2022-04-15 DIAGNOSIS — K219 Gastro-esophageal reflux disease without esophagitis: Secondary | ICD-10-CM

## 2022-04-15 MED ORDER — OMEPRAZOLE 40 MG PO CPDR: 40.0000 mg | DELAYED_RELEASE_CAPSULE | Freq: Every day | ORAL | 3 refills | Status: DC

## 2022-04-15 NOTE — Telephone Encounter (Signed)
Pt made aware of Dr. Gupta recommendations: Prescription sent to pharmacy. Pt made aware. Pt verbalized understanding with all questions answered.   

## 2022-04-19 ENCOUNTER — Encounter: Payer: Self-pay | Admitting: Gastroenterology

## 2022-04-22 ENCOUNTER — Other Ambulatory Visit: Payer: Self-pay

## 2022-04-22 DIAGNOSIS — K580 Irritable bowel syndrome with diarrhea: Secondary | ICD-10-CM

## 2022-04-22 DIAGNOSIS — R197 Diarrhea, unspecified: Secondary | ICD-10-CM

## 2022-04-22 NOTE — Telephone Encounter (Signed)
Reviewed allergy consult note-allergy physicians are not convinced that he has alpha gal.  Plan: -Can resume red meat and observe symptoms -Needs skin testing as per allergy notes.  He needs to make appointment -Check CBC, CMP, TSH, CRP -Monitor weight.  His previous CT scan was negative for any weight loss. -FU in my clinic or app clinic-routine for now.  Will work him in if there is any further unintentional weight loss RG

## 2022-04-25 NOTE — Progress Notes (Signed)
Agree with assessment/plan.  Raj Partick Musselman, MD [facility] [phone]  

## 2022-05-03 NOTE — Progress Notes (Unsigned)
Follow Up Note  RE: Shaun Boyer MRN: [id number] DOB: 1975/08/17 Date of Office Visit: 05/04/2022  Referring provider: Estanislado Pandy, MD Primary care provider: Estanislado Pandy, MD  Chief Complaint:No chief complaint on file.   Assessment and Plan: Shaun Boyer is a 47 y.o. male with: No problem-specific Assessment & Plan notes found for this encounter.  No follow-ups on file.  Challenge food: *** Challenge as per protocol: {Blank single:19197::"Passed","Failed"} Total time: ***  Do not eat challenge food for next 24 hours and monitor for hives, swelling, shortness of breath and dizziness. If you see these symptoms, use Benadryl for mild symptoms and epinephrine for more severe symptoms and call 911.  If no adverse symptoms in the next 24 hours, repeat the challenge food the next day and observe for 1 hour. If no adverse symptoms, can eat the food on regular basis.   History of Present Illness: I had the pleasure of seeing Shaun Boyer for a follow up visit at the [facility] on 05/03/2022. He is a 47 y.o. male, who is being followed for adverse food reactions. His previous allergy office visit was on 326/2024 with Dr. Selena Batten. Today he is here for skin testing and possible red meat food challenge.   History of Reaction: Gastrointestinal complaints Follows with GI for IBS, diverticulitis, GERD. Patient had recent bout of C diff and having a lot of GI symptoms. EGD showed gastritis but stopped PPI due to muscle aches. Negative Celiac's. Bloodwork was borderline positive to alpha-gal. Patient was eating red meat daily. Eliminated from his diet for 1 week and feeling better. Denies any immediate symptoms, rash or hives. Mainly GI issues which he can't recall if it was from the red meat or his underlying other GI issues. Patient would like to go back to eating red meat. Denies recent known tick bites.  Unable to skin test today as [facility] won't allow new patient visits and procedures  on the same day.  Given clinical history and low borderline positive to alpha-gal, I'm not convinced that he has alpha-gal allergy contributing to his GI symptoms.  Return for food skin testing (select common foods, pork, beef, lamb). Discussed that skin testing checks for IgE mediated reactions and his symptoms do not seem to be IgE mediated. He may have some difficulty digesting red meat which is not picked up by skin testing.  If negative will do food challenge on the same day to either hamburger or pork sausage patties.  Advised patient to not change his diet and to continue to avoid all red/mammalian meat.  Keep a journal of foods eaten and symptoms. Follow up with GI regarding what medication to take for the gastritis. Food challenge instructions: You must be off antihistamines for 3-5 days before. Must be in good health and not ill. No vaccines/injections/antibiotics within the past 7 days. Plan on being in the office for 2-3 hours and must bring in the food you want to do the oral challenge for - 1 fully cooked hamburger patty OR 2 sausage patties. You must call to schedule an appointment and specify it's for a food challenge.    Return for Food challenge, Skin testing.  Labs/skin testing: *** Interval History: Patient has not been ill, he has not had any accidental exposures to the culprit food.   Recent/Current History: Pulmonary disease: {Blank single:19197::"yes","no"} Cardiac disease: {Blank single:19197::"yes","no"} Respiratory infection: {Blank single:19197::"yes","no"} Rash: {Blank single:19197::"yes","no"} Itch: {Blank single:19197::"yes","no"} Swelling: {Blank single:19197::"yes","no"} Cough: {Blank single:19197::"yes","no"}  Shortness of breath: {Blank single:19197::"yes","no"} Runny/stuffy nose: {Blank single:19197::"yes","no"} Itchy eyes: {Blank single:19197::"yes","no"} Beta-blocker use: {Blank single:19197::"yes","no"}  Patient/guardian was informed of the test  procedure with verbalized understanding of the risk of anaphylaxis. Consent was signed.   Last antihistamine use: *** Last beta-blocker use: ***  Medication List:  Current Outpatient Medications  Medication Sig Dispense Refill   cholecalciferol (VITAMIN D3) 25 MCG (1000 UNIT) tablet Take 1,000 Units by mouth daily.     dicyclomine (BENTYL) 20 MG tablet Take 1 tablet (20 mg total) by mouth 2 (two) times daily. 20 tablet 0   dicyclomine (BENTYL) 20 MG tablet Take 1 tablet (20 mg total) by mouth 3 (three) times daily as needed for spasms. 90 tablet 2   hyoscyamine (LEVSIN SL) 0.125 MG SL tablet Place 1 tablet (0.125 mg total) under the tongue every 6 (six) hours as needed. 120 tablet 5   loratadine (CLARITIN) 10 MG tablet Take 10 mg by mouth daily.     omeprazole (PRILOSEC) 40 MG capsule Take 1 capsule (40 mg total) by mouth daily. 90 capsule 3   zinc sulfate 220 (50 Zn) MG capsule Take 220 mg by mouth daily.     Current Facility-Administered Medications  Medication Dose Route Frequency Provider Last Rate Last Admin   0.9 %  sodium chloride infusion  500 mL Intravenous Once Lynann Bologna, MD        Allergies: No Known Allergies  I reviewed his past medical history, social history, family history, and environmental history and no significant changes have been reported from his previous visit.   Review of Systems  Constitutional:  Positive for unexpected weight change (due to his recent GI issues). Negative for appetite change, chills and fever.  HENT:  Negative for congestion and rhinorrhea.   Eyes:  Negative for itching.  Respiratory:  Negative for cough, chest tightness, shortness of breath and wheezing.   Cardiovascular:  Negative for chest pain.  Gastrointestinal:  Positive for abdominal pain and diarrhea. Negative for constipation, nausea and vomiting.  Genitourinary:  Negative for difficulty urinating.  Skin:  Negative for rash.  Neurological:  Negative for headaches.     Objective: There were no vitals taken for this visit. There is no height or weight on file to calculate BMI. Physical Exam Vitals and nursing note reviewed.  Constitutional:      Appearance: Normal appearance. He is well-developed.  HENT:     Head: Normocephalic and atraumatic.     Right Ear: Tympanic membrane and external ear normal.     Left Ear: Tympanic membrane and external ear normal.     Nose: Nose normal.     Mouth/Throat:     Mouth: Mucous membranes are moist.     Pharynx: Oropharynx is clear.  Eyes:     Conjunctiva/sclera: Conjunctivae normal.  Cardiovascular:     Rate and Rhythm: Normal rate and regular rhythm.     Heart sounds: Normal heart sounds. No murmur heard.    No friction rub. No gallop.  Pulmonary:     Effort: Pulmonary effort is normal.     Breath sounds: Normal breath sounds. No wheezing, rhonchi or rales.  Musculoskeletal:     Cervical back: Neck supple.  Skin:    General: Skin is warm.     Findings: No rash.  Neurological:     Mental Status: He is alert and oriented to person, place, and time.  Psychiatric:        Behavior: Behavior normal.  Diagnostics: Spirometry:  Tracings reviewed. His effort: {Blank single:19197::"Good reproducible efforts.","It was hard to get consistent efforts and there is a question as to whether this reflects a maximal maneuver.","Poor effort, data can not be interpreted."} FVC: ***L FEV1: ***L, ***% predicted FEV1/FVC ratio: ***% Interpretation: {Blank single:19197::"Spirometry consistent with mild obstructive disease","Spirometry consistent with moderate obstructive disease","Spirometry consistent with severe obstructive disease","Spirometry consistent with possible restrictive disease","Spirometry consistent with mixed obstructive and restrictive disease","Spirometry uninterpretable due to technique","Spirometry consistent with normal pattern","No overt abnormalities noted given today's efforts"}.  Please see  scanned spirometry results for details.  Skin Testing: {Blank single:19197::"None","Deferred due to recent antihistamines use"}. Positive test to: ***. Negative test to: ***.  Results discussed with patient/family.   Previous notes and tests were reviewed. The plan was reviewed with the patient/family, and all questions/concerned were addressed.  It was my pleasure to see Deanglo today and participate in his care. Please feel free to contact me with any questions or concerns.  Sincerely,  Wyline Mood, DO Allergy & Immunology  [facility] office: [phone] [facility] office: [phone]

## 2022-05-04 ENCOUNTER — Ambulatory Visit (INDEPENDENT_AMBULATORY_CARE_PROVIDER_SITE_OTHER): Payer: 59 | Admitting: Allergy

## 2022-05-04 ENCOUNTER — Encounter: Payer: Self-pay | Admitting: Allergy

## 2022-05-04 ENCOUNTER — Other Ambulatory Visit: Payer: Self-pay

## 2022-05-04 VITALS — BP 114/82 | HR 81 | Temp 98.3°F | Resp 20 | Wt 211.5 lb

## 2022-05-04 DIAGNOSIS — R198 Other specified symptoms and signs involving the digestive system and abdomen: Secondary | ICD-10-CM

## 2022-05-04 DIAGNOSIS — T781XXA Other adverse food reactions, not elsewhere classified, initial encounter: Secondary | ICD-10-CM | POA: Diagnosis not present

## 2022-05-04 DIAGNOSIS — T781XXD Other adverse food reactions, not elsewhere classified, subsequent encounter: Secondary | ICD-10-CM

## 2022-05-04 NOTE — Patient Instructions (Addendum)
Negative skin testing today. Results given.   Do not eat challenge food for next 24 hours.  If no adverse symptoms in the next 24 hours, repeat the challenge food the next day and observe for 1 hour. If no adverse symptoms, can eat the food on regular basis.   Keep a journal of foods eaten and symptoms.  Follow up with GI regarding what medication to take for your gastritis.  Follow up with me only as needed.

## 2022-05-04 NOTE — Assessment & Plan Note (Signed)
Past history - follows with GI for IBS, diverticulitis, GERD. Patient had recent bout of C diff and having a lot of GI symptoms. EGD showed gastritis but stopped PPI due to muscle aches. Negative Celiac's. Bloodwork was borderline positive to alpha-gal. Patient was eating red meat daily. Eliminated from his diet for 1 week and feeling better. Denies any immediate symptoms, rash or hives. Mainly GI issues which he can't recall if it was from the red meat or his underlying other GI issues. Patient would like to go back to eating red meat. Denies recent known tick bites.  Interim history - no issues since last visit. Today's skin prick testing to select foods were negative. Patient tolerated 1 hamburger patty divided in 2 doses with no issues. Vitals and physical exam normal upon discharge.  Discussed that he most likely doesn't have IgE mediated reaction to red meat.   Do not eat challenge food for next 24 hours. If no adverse symptoms in the next 24 hours, repeat the challenge food the next day and observe for 1 hour. If no adverse symptoms, can eat the food on regular basis.  Keep a journal of foods eaten and symptoms. Follow up with GI regarding what medication to take for your gastritis.

## 2022-06-11 ENCOUNTER — Ambulatory Visit: Payer: 59 | Admitting: Physician Assistant

## 2022-06-11 ENCOUNTER — Encounter: Payer: Self-pay | Admitting: Physician Assistant

## 2022-06-11 VITALS — BP 130/88 | HR 88 | Ht 73.0 in | Wt 213.0 lb

## 2022-06-11 DIAGNOSIS — Z91018 Allergy to other foods: Secondary | ICD-10-CM

## 2022-06-11 DIAGNOSIS — K209 Esophagitis, unspecified without bleeding: Secondary | ICD-10-CM

## 2022-06-11 DIAGNOSIS — K580 Irritable bowel syndrome with diarrhea: Secondary | ICD-10-CM | POA: Diagnosis not present

## 2022-06-11 DIAGNOSIS — K219 Gastro-esophageal reflux disease without esophagitis: Secondary | ICD-10-CM

## 2022-06-11 MED ORDER — OMEPRAZOLE 20 MG PO CPDR: 20.0000 mg | DELAYED_RELEASE_CAPSULE | Freq: Every day | ORAL | 3 refills | Status: DC

## 2022-06-11 MED ORDER — DICYCLOMINE HCL 20 MG PO TABS: 20.0000 mg | ORAL_TABLET | Freq: Three times a day (TID) | ORAL | 8 refills | Status: DC | PRN

## 2022-06-11 NOTE — Progress Notes (Signed)
Subjective:    Patient ID: Shaun Boyer, male    DOB: [birth year], 47 y.o.   MRN: [id number]  HPI  [name] is a pleasant 47 year old white male, established with Dr. Chales Abrahams who was last seen in the office in March 2024 and returns for follow-up today.  He  was last seen on 03/25/2022 for follow-up of intermittent nausea and vomiting.  He has had negative CT of the abdomen and pelvis and unremarkable labs. 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 He had had negative infectious workup, normal fecal elastase and calprotectin. He does have prior history of sigmoid diverticulitis in 2022 which was then complicated by C. Difficile Because he had persistent complaints of diarrhea and urgency after meals celiac serology was done which was negative, and alpha gal panel was done which was positive. He was also scheduled for EGD with small bowel biopsies.  Found at EGD to have grade C esophagitis, mild antral gastritis.  Biopsies showed reactive duodenitis no increased lymphocytes, mild chronic gastritis negative for H. Pylori, and esophageal biopsies showed changes of reflux esophagitis.  He has since undergone evaluation with allergist Dr. Gerrit Friends tested negative for food allergies.  He was then given a sample trial of a hamburger while being observed in the office.  He did not have any symptoms at all while he was being observed and therefore it was not felt that he had an IgE mediated reaction to red meat. However the patient says within 12 hours after consuming the hamburger he was having bad abdominal pain cramping and nausea.  He has not rechallenged himself but really likes red meat and is hoping that he can find some way to be able to consume it again. He says he is going to see an acupuncturist who has had some success with this. At this time off of red meat he is not having any significant daily symptoms he has been taking the Bentyl fairly regularly and is taking  omeprazole 40 mg for GERD.  He did not tolerate a trial of Protonix which caused joint pain.  He still tends to have looser stools but not nearly to the extent that he was having previously and with much less urgency.  He really wants to avoid having to take a lot of chronic medications/prescription medications and does not want to get to the point in life where he has to take a prescription medicine to counteract another prescription medicine.  He is okay taking omeprazole because he has had improvement in GERD symptoms.  And for now is okay to continue Bentyl/dicyclomine because it has been helpful.  Review of Systems Pertinent positive and negative review of systems were noted in the above HPI section.  All other review of systems was otherwise negative.   Outpatient Encounter Medications as of 06/11/2022  Medication Sig   cholecalciferol (VITAMIN D3) 25 MCG (1000 UNIT) tablet Take 1,000 Units by mouth daily.   hyoscyamine (LEVSIN SL) 0.125 MG SL tablet Place 1 tablet (0.125 mg total) under the tongue every 6 (six) hours as needed.   loratadine (CLARITIN) 10 MG tablet Take 10 mg by mouth daily.   omeprazole (PRILOSEC) 20 MG capsule Take 1 capsule (20 mg total) by mouth daily. Every AM   zinc sulfate 220 (50 Zn) MG capsule Take 220 mg by mouth daily.   [DISCONTINUED] dicyclomine (BENTYL) 20 MG tablet Take 1 tablet (20 mg total) by mouth 3 (three) times daily as needed for spasms.   [  DISCONTINUED] omeprazole (PRILOSEC) 40 MG capsule Take 1 capsule (40 mg total) by mouth daily.   dicyclomine (BENTYL) 20 MG tablet Take 1 tablet (20 mg total) by mouth 3 (three) times daily as needed for spasms. And abdominal cramps   [DISCONTINUED] dicyclomine (BENTYL) 20 MG tablet Take 1 tablet (20 mg total) by mouth 2 (two) times daily. (Patient not taking: Reported on 05/04/2022)   Facility-Administered Encounter Medications as of 06/11/2022  Medication   0.9 %  sodium chloride infusion   No Known  Allergies Patient Active Problem List   Diagnosis Date Noted   Other adverse food reactions, not elsewhere classified, subsequent encounter 05/04/2022   Gastrointestinal complaints 04/13/2022   C. difficile diarrhea 10/19/2021   Tobacco use 02/16/2018   Chest pain 07/07/2010   Social History   Socioeconomic History   Marital status: Married    Spouse name: Not on file   Number of children: Not on file   Years of education: Not on file   Highest education level: Not on file  Occupational History   Not on file  Tobacco Use   Smoking status: Former    Packs/day: 2.00    Years: 10.00    Additional pack years: 0.00    Total pack years: 20.00    Types: Cigarettes    Quit date: 01/19/2008    Years since quitting: 14.4   Smokeless tobacco: Current    Types: Snuff   Tobacco comments:    dip 1.5 cans snuff/day  Vaping Use   Vaping Use: Never used  Substance and Sexual Activity   Alcohol use: No   Drug use: No   Sexual activity: Not on file  Other Topics Concern   Not on file  Social History Narrative   Married with 3 sons ages 60,6,and 2   Employed at [employer]   Has 3 brothers and 2 sisters who are overall in good health   Social Determinants of Health   Financial Resource Strain: Not on file  Food Insecurity: Not on file  Transportation Needs: Not on file  Physical Activity: Not on file  Stress: Not on file  Social Connections: Not on file  Intimate Partner Violence: Not on file    Mr. Walston family history includes Coronary artery disease in his maternal grandmother and maternal uncle; Diabetes in his brother and mother; Diverticulitis in his brother and paternal grandmother; Diverticulosis in his brother; Heart attack in his brother and paternal grandmother; Heart attack (age of onset: 34) in his father; Multiple sclerosis in his father.      Objective:    Vitals:   06/11/22 0905  BP: 130/88  Pulse: 88    Physical Exam. Well-developed  well-nourished WM in no acute distress.  Height, [id number], BMI 28   HEENT; nontraumatic normocephalic, EOMI, PE R LA, sclera anicteric.  Extremities; no clubbing cyanosis or edema skin warm and dry Neuro/Psych; alert and oriented x4, grossly nonfocal mood and affect appropriate        Assessment & Plan:   #26 47 year old white male with long history of IBS-D symptoms.  He has had a complete workup and most recently had TTG and IgA which were negative but then tested positive for alpha gal. He has stopped eating red meat which she does miss. He has had allergy testing and did not test positive for any food allergies but was given a trial hamburger at the allergist and then observed for any reaction.  Per the notes he did  not have any symptoms suggestive of an IgE mediated response and was to very gradually reintroduce small amounts of red meat and then observe for symptoms. He says he has not done that because within 12 hours of consuming that hamburger he had severe abdominal pain and cramping like what he had experienced in the past including diarrhea.  He is pursuing acupuncture treatment to see if that will help.  In the meantime symptoms improved with use of Bentyl/dicyclomine twice daily to 3 times daily and avoidance of red meat.  #2 grade C esophagitis noted at EGD March 2024, and mild gastritis, small bowel biopsies were done without any evidence of celiac disease or Whipple. He is on omeprazole 40 mg p.o. daily, says he has never had any traditional heartburn indigestion type symptoms.  #3 colon cancer screening-up-to-date with colonoscopy done August 2023, no polyps, and random biopsies were unremarkable.  #4 status post cholecystectomy  Plan; continue Bentyl 20 mg p.o. twice daily to 3 times daily, refill sent Will decrease omeprazole to 20 mg p.o. every morning once he completes his current month of prescription.  I have sent a new prescription for 20 mg tablets. He can  continue Levsin sublingual as needed. Follow-up with Dr. Chales Abrahams in 1 year or sooner as needed.       Jalayah Gutridge Oswald Hillock PA-C 06/11/2022   Cc: Sasser, Clarene Critchley, MD

## 2022-06-11 NOTE — Patient Instructions (Addendum)
We have sent the following medications to your pharmacy for you to pick up at your convenience: Bentyl Omeprazole 20 mg  Continue to AVOID Red Meat   Follow up in 1 year or as needed _______________________________________________________  If your blood pressure at your visit was 140/90 or greater, please contact your primary care physician to follow up on this.  _______________________________________________________  If you are age 47 or older, your body mass index should be between 23-30. Your Body mass index is 28.1 kg/m. If this is out of the aforementioned range listed, please consider follow up with your Primary Care Provider.  If you are age 84 or younger, your body mass index should be between 19-25. Your Body mass index is 28.1 kg/m. If this is out of the aformentioned range listed, please consider follow up with your Primary Care Provider.   ________________________________________________________  The [facility] providers would like to encourage you to use [facility] to communicate with providers for non-urgent requests or questions.  Due to long hold times on the telephone, sending your provider a message by [facility] may be a faster and more efficient way to get a response.  Please allow 48 business hours for a response.  Please remember that this is for non-urgent requests.  _______________________________________________________   I appreciate the  opportunity to care for you  Thank You   Amy Monica Becton , PA-C

## 2022-06-17 NOTE — Progress Notes (Signed)
Agree with assessment/plan.  Raj Abdul Beirne, MD [facility] [phone]  

## 2022-08-24 ENCOUNTER — Encounter: Payer: Self-pay | Admitting: Gastroenterology

## 2022-08-24 ENCOUNTER — Telehealth: Payer: Self-pay | Admitting: *Deleted

## 2022-08-24 NOTE — Telephone Encounter (Signed)
Called patient in referenced to complaints of abdominal pain. Patient states he has been on ABX x's 2 via his PCP. Patient also states he has been to the hospital more than he can afford and needs for something to be done. Patient only wants to be seen by Dr. Chales Abrahams, stating he needs for him to help,since patient has been satisfied with the procedures Dr. Chales Abrahams has performed in the past. Patient is scheduled for 10/26/22 at 8:50am. Informed patient to call back to see if sooner appt is available when able to do so. Patient agreed.

## 2022-08-27 ENCOUNTER — Other Ambulatory Visit: Payer: Self-pay | Admitting: Nurse Practitioner

## 2022-08-27 DIAGNOSIS — R112 Nausea with vomiting, unspecified: Secondary | ICD-10-CM

## 2022-10-26 ENCOUNTER — Encounter: Payer: Self-pay | Admitting: Gastroenterology

## 2022-10-26 ENCOUNTER — Ambulatory Visit: Payer: 59 | Admitting: Gastroenterology

## 2022-10-26 VITALS — BP 120/80 | HR 80 | Ht 73.0 in | Wt 212.0 lb

## 2022-10-26 DIAGNOSIS — K2100 Gastro-esophageal reflux disease with esophagitis, without bleeding: Secondary | ICD-10-CM | POA: Diagnosis not present

## 2022-10-26 DIAGNOSIS — K580 Irritable bowel syndrome with diarrhea: Secondary | ICD-10-CM

## 2022-10-26 DIAGNOSIS — Z8719 Personal history of other diseases of the digestive system: Secondary | ICD-10-CM | POA: Diagnosis not present

## 2022-10-26 MED ORDER — OMEPRAZOLE 20 MG PO CPDR: 20.0000 mg | DELAYED_RELEASE_CAPSULE | Freq: Every day | ORAL | 3 refills | Status: AC

## 2022-10-26 MED ORDER — DICYCLOMINE HCL 20 MG PO TABS: 20.0000 mg | ORAL_TABLET | Freq: Three times a day (TID) | ORAL | 3 refills | Status: DC | PRN

## 2022-10-26 MED ORDER — ONDANSETRON 4 MG PO TBDP: 4.0000 mg | ORAL_TABLET | Freq: Three times a day (TID) | ORAL | 0 refills | Status: DC | PRN

## 2022-10-26 NOTE — Progress Notes (Signed)
FU     Assessment & Plan    #1. GERD with EE.   #2. IBS-D.  With postcholecystectomy diarrhea. Also assoc alpha gal. Neg stool for GI pathogen, C. Difficile, calprotectin, fecal elastase. Neg colon to TI with random TI and colonic bx 08/2021  #3. H/O sig diverticulitis June 2022, treated with Augmentin then complicated by C. difficile colitis requiring vancomycin.  Plan:  -Bentyl 20mg  po TID #90, 2RF -Omerazole 20mg  po every day #90 -Continue zofran 4mg  ODT prn #20 -Continue acupuncture. - FU 1 year.      HPI:     Shaun Boyer is a 47 y.o. year old male with a past medical history of GERD, IBS-D, H/O C. difficile colitis, Gilbert's, OSA (not on CPAP), diverticulitis, cholecystectomy , splenomegaly  FU  S/P acupuncture which really has helped Feels better Able to toerate red meat. Has been to allergist's-they do not believe it is true IgE mediated alpha gal.  He does have sensitivity to red meat.  Overall has been feeling better  Had lots of questions regarding his medications.  We have decided to continue omeprazole due to history of erosive esophagitis as maintenance.  Continue Zofran on as-needed basis.  He uses it once or twice a month as needed for nausea.  He can use Bentyl on as-needed basis.  It does help with diarrhea.  He has been sleeping well.  Has stopped using his CPAP.    From previous notes: ED visit 03/23/2022 with intractable Abdo pain, bloating, diarrhea, then N/V Had normal CBC, CMP, lipase. Stool studies were neg for GI pathogens, C. Difficile CT Abdo/pelvis with contrast: neg for diverticulitis or any acute abnormalities. Dx with IBS exacerbation-pain controlled by fentanyl, given Bentyl and Zofran   No sodas, chocolates, chewing gums, artificial sweeteners and candy. No NSAIDs  FH-positive for diverticulosis Brother     Past GI history:  EGD 03/25/2022 - LA Grade C reflux esophagitis with no bleeding. Bx-esophagitis.  Negative EoE -  Gastritis. Biopsied.  Negative for HP - Normal examined duodenum. Bx: neg for celiac  CT AP with contrast 03/23/2022 1. No CT evidence of acute abdominal/pelvic process. 2. Scattered colonic diverticulosis without evidence of acute diverticulitis. 3. Status post cholecystectomy.  Colonoscopy 09/15/2021 - Moderate predominantly sigmoid diverticulosis.  - Non-bleeding internal hemorrhoids.  - Otherwise normal colonoscopy to TI. - Negative TI and random colon biopsies - Rpt 10 yrs. earlier, if with any problems.   CT Abdo/pelvis with contrast 08/2021  Mild fluid within the rectosigmoid colon with some mucosal enhancement, question mild proctocolitis. Diverticular disease of the descending and sigmoid colon without convincing evidence for diverticulitis.   CT Abdo/pelvis with contrast 06/2020 Acute sigmoid diverticulitis without abscess or free intraperitoneal air.         Past Medical History:  Diagnosis Date   Diarrhea      predominant irritable syndrome   GERD (gastroesophageal reflux disease)     Gilbert's disease     Obstructive sleep apnea      on CPAP   Spleen enlarged           Past Surgical History:  Procedure Laterality Date   CHOLECYSTECTOMY   11-17-05   WISDOM TOOTH EXTRACTION             Family History  Problem Relation Age of Onset   Heart attack Father 93        multiple stents,passed away in 11/17/08 with multiple complications   Multiple  sclerosis Father     Coronary artery disease Maternal Grandmother          bypass surgery times 5 vessel   Coronary artery disease Maternal Uncle          with stents and bypass   Heart attack Paternal Grandmother      Social History         Tobacco Use   Smoking status: Former      Packs/day: 2.00      Years: 10.00      Total pack years: 20.00      Types: Cigarettes      Quit date: 01/19/2008      Years since quitting: 13.6   Smokeless tobacco: Current      Types: Snuff   Tobacco comments:      dip 1.5 cans  snuff/day  Substance Use Topics   Alcohol use: No   Drug use: No          Current Outpatient Medications  Medication Sig Dispense Refill   loratadine (CLARITIN) 10 MG tablet Take 10 mg by mouth daily.       omeprazole (PRILOSEC) 40 MG capsule Take 40 mg by mouth daily.       ondansetron (ZOFRAN) 4 MG tablet Take 1 tablet (4 mg total) by mouth every 8 (eight) hours as needed for nausea or vomiting. 12 tablet 0   propranolol ER (INDERAL LA) 60 MG 24 hr capsule Take 60 mg by mouth daily.       clonazePAM (KLONOPIN) 0.5 MG tablet Take 1 tablet by mouth 3 (three) times daily as needed.  (Patient not taking: Reported on 09/03/2021)               oxyCODONE-acetaminophen (PERCOCET/ROXICET) 5-325 MG tablet Take 1 tablet by mouth every 4 (four) hours as needed for severe pain. (Patient not taking: Reported on 09/03/2021) 15 tablet 0   sertraline (ZOLOFT) 50 MG tablet Take 1 tablet by mouth Daily. (Patient not taking: Reported on 09/03/2021)       traMADol (ULTRAM) 50 MG tablet Take 1 tablet (50 mg total) by mouth every 6 (six) hours as needed. (Patient not taking: Reported on 09/03/2021) 30 tablet 0    No current facility-administered medications for this visit.    No Known Allergies     Review of Systems:  All systems reviewed and negative except where noted in HPI.       Wt Readings from Last 3 Encounters:  04/06/18 230 lb (104.3 kg)  03/23/18 230 lb (104.3 kg)  03/09/18 230 lb (104.3 kg)      Physical Exam    BP 114/86   Pulse 85   Ht 6\' 1"  (1.854 m)   Wt 223 lb (101.2 kg)   BMI 29.42 kg/m  Constitutional:  Generally well appearing male in no acute distress. Psychiatric: Pleasant. Normal mood and affect. Behavior is normal. EENT: Pupils normal.  Conjunctivae are normal. No scleral icterus. Neck supple.  Cardiovascular: Normal rate, regular rhythm. No edema Pulmonary/chest: Effort normal and breath sounds normal. No wheezing, rales or rhonchi. Abdominal: Soft, moderate left mid  abdominal tenderness.  Bowel sounds active throughout. There are no masses palpable. No hepatomegaly. Neurological: Alert and oriented to person place and time. Skin: Skin is warm and dry. No rashes noted.      Latest Ref Rng & Units 03/23/2022   12:02 PM 03/19/2022   12:28 PM 10/19/2021    2:32 PM  CBC  WBC 4.0 -  10.5 K/uL 5.2  4.8  6.7   Hemoglobin 13.0 - 17.0 g/dL 16.1  09.6  04.5   Hematocrit 39.0 - 52.0 % 47.0  46.3  45.3   Platelets 150 - 400 K/uL 252  224.0  284.0       Latest Ref Rng & Units 03/23/2022   12:02 PM 03/19/2022   12:28 PM 10/19/2021    2:32 PM  CMP  Glucose 70 - 99 mg/dL 409  91  83   BUN 6 - 20 mg/dL 18  17  18    Creatinine 0.61 - 1.24 mg/dL 8.11  9.14  7.82   Sodium 135 - 145 mmol/L 141  139  139   Potassium 3.5 - 5.1 mmol/L 4.1  4.1  4.3   Chloride 98 - 111 mmol/L 103  103  103   CO2 22 - 32 mmol/L 24  28  29    Calcium 8.9 - 10.3 mg/dL 9.6  9.7  9.8   Total Protein 6.5 - 8.1 g/dL 7.4  6.9    Total Bilirubin 0.3 - 1.2 mg/dL 2.9  3.0    Alkaline Phos 38 - 126 U/L 55  53    AST 15 - 41 U/L 18  13    ALT 0 - 44 U/L 24  20     Lipase     Component Value Date/Time   LIPASE 34 03/23/2022 1202     Edman Circle, MD [facility] [phone]

## 2022-10-26 NOTE — Patient Instructions (Addendum)
_______________________________________________________  If your blood pressure at your visit was 140/90 or greater, please contact your primary care physician to follow up on this.  _______________________________________________________  If you are age 47 or older, your body mass index should be between 23-30. Your Body mass index is 27.97 kg/m. If this is out of the aforementioned range listed, please consider follow up with your Primary Care Provider.  If you are age 82 or younger, your body mass index should be between 19-25. Your Body mass index is 27.97 kg/m. If this is out of the aformentioned range listed, please consider follow up with your Primary Care Provider.   ________________________________________________________  The [facility] providers would like to encourage you to use [facility] to communicate with providers for non-urgent requests or questions.  Due to long hold times on the telephone, sending your provider a message by [facility] may be a faster and more efficient way to get a response.  Please allow 48 business hours for a response.  Please remember that this is for non-urgent requests.  _______________________________________________________  We have sent the following medications to your pharmacy for you to pick up at your convenience: Bentyl Omeprazole Zofran  Please follow up in 12 months. Give [place] a call at [phone] to schedule an appointment.  Thank you,  Dr. Lynann Bologna

## 2023-02-20 ENCOUNTER — Other Ambulatory Visit: Payer: Self-pay | Admitting: Gastroenterology

## 2023-04-12 ENCOUNTER — Other Ambulatory Visit: Payer: Self-pay | Admitting: Nurse Practitioner

## 2023-04-12 DIAGNOSIS — R112 Nausea with vomiting, unspecified: Secondary | ICD-10-CM

## 2023-04-14 ENCOUNTER — Other Ambulatory Visit: Payer: Self-pay

## 2023-04-14 MED ORDER — ONDANSETRON 4 MG PO TBDP: 4.0000 mg | ORAL_TABLET | Freq: Three times a day (TID) | ORAL | 0 refills | Status: AC | PRN

## 2023-04-21 ENCOUNTER — Other Ambulatory Visit: Payer: Self-pay

## 2023-04-21 ENCOUNTER — Emergency Department (HOSPITAL_COMMUNITY): Admission: EM | Admit: 2023-04-21 | Discharge: 2023-04-21 | Disposition: A | Discharge status: Home / Self Care

## 2023-04-21 ENCOUNTER — Emergency Department (HOSPITAL_COMMUNITY)

## 2023-04-21 DIAGNOSIS — R110 Nausea: Secondary | ICD-10-CM | POA: Diagnosis not present

## 2023-04-21 DIAGNOSIS — R109 Unspecified abdominal pain: Secondary | ICD-10-CM | POA: Diagnosis present

## 2023-04-21 DIAGNOSIS — R1084 Generalized abdominal pain: Secondary | ICD-10-CM | POA: Insufficient documentation

## 2023-04-21 DIAGNOSIS — G8929 Other chronic pain: Secondary | ICD-10-CM | POA: Insufficient documentation

## 2023-04-21 LAB — URINALYSIS, ROUTINE W REFLEX MICROSCOPIC
Bilirubin Urine: NEGATIVE
Glucose, UA: NEGATIVE mg/dL
Hgb urine dipstick: NEGATIVE
Ketones, ur: NEGATIVE mg/dL
Leukocytes,Ua: NEGATIVE
Nitrite: NEGATIVE
Protein, ur: NEGATIVE mg/dL
Specific Gravity, Urine: 1.015 (ref 1.005–1.030)
pH: 6 (ref 5.0–8.0)

## 2023-04-21 LAB — CBC
HCT: 46.8 % (ref 39.0–52.0)
Hemoglobin: 16.1 g/dL (ref 13.0–17.0)
MCH: 29.5 pg (ref 26.0–34.0)
MCHC: 34.4 g/dL (ref 30.0–36.0)
MCV: 85.9 fL (ref 80.0–100.0)
Platelets: 227 10*3/uL (ref 150–400)
RBC: 5.45 MIL/uL (ref 4.22–5.81)
RDW: 13.2 % (ref 11.5–15.5)
WBC: 7.3 10*3/uL (ref 4.0–10.5)
nRBC: 0 % (ref 0.0–0.2)

## 2023-04-21 LAB — COMPREHENSIVE METABOLIC PANEL WITH GFR
ALT: 32 U/L (ref 0–44)
AST: 19 U/L (ref 15–41)
Albumin: 4.1 g/dL (ref 3.5–5.0)
Alkaline Phosphatase: 60 U/L (ref 38–126)
Anion gap: 10 (ref 5–15)
BUN: 11 mg/dL (ref 6–20)
CO2: 26 mmol/L (ref 22–32)
Calcium: 9.3 mg/dL (ref 8.9–10.3)
Chloride: 104 mmol/L (ref 98–111)
Creatinine, Ser: 1.08 mg/dL (ref 0.61–1.24)
GFR, Estimated: >60 mL/min (ref >60)
Glucose, Bld: 100 mg/dL — ABNORMAL HIGH (ref 70–99)
Potassium: 3.9 mmol/L (ref 3.5–5.1)
Sodium: 140 mmol/L (ref 135–145)
Total Bilirubin: 2.4 mg/dL — ABNORMAL HIGH (ref 0.0–1.2)
Total Protein: 7.1 g/dL (ref 6.5–8.1)

## 2023-04-21 LAB — LIPASE, BLOOD: Lipase: 39 U/L (ref 11–51)

## 2023-04-21 MED ORDER — ALUM & MAG HYDROXIDE-SIMETH 200-200-20 MG/5ML PO SUSP
30.0000 mL | Freq: Once | ORAL | Status: AC
  Administered 2023-04-21: 30 mL via ORAL
  Filled 2023-04-21: qty 30

## 2023-04-21 MED ORDER — OXYCODONE-ACETAMINOPHEN 5-325 MG PO TABS: 1.0000 | ORAL_TABLET | Freq: Four times a day (QID) | ORAL | 0 refills | Status: AC | PRN

## 2023-04-21 MED ORDER — METOCLOPRAMIDE HCL 10 MG PO TABS: 10.0000 mg | ORAL_TABLET | Freq: Four times a day (QID) | ORAL | 0 refills | Status: AC

## 2023-04-21 MED ORDER — MORPHINE SULFATE (PF) 2 MG/ML IV SOLN
2.0000 mg | Freq: Once | INTRAVENOUS | Status: AC
  Administered 2023-04-21: 2 mg via INTRAVENOUS
  Filled 2023-04-21: qty 1

## 2023-04-21 MED ORDER — DICYCLOMINE HCL 10 MG PO CAPS
10.0000 mg | ORAL_CAPSULE | Freq: Once | ORAL | Status: AC
  Administered 2023-04-21: 10 mg via ORAL
  Filled 2023-04-21: qty 1

## 2023-04-21 MED ORDER — IOHEXOL 350 MG/ML SOLN
75.0000 mL | Freq: Once | INTRAVENOUS | Status: AC | PRN
  Administered 2023-04-21: 75 mL via INTRAVENOUS

## 2023-04-21 NOTE — ED Provider Notes (Signed)
 [facility] AT [facility] Provider Note   CSN: [id number] Arrival date & time: 04/21/23  1100     History  Chief Complaint  Patient presents with   Abdominal Pain   Nausea    Shaun Boyer is a 48 y.o. male.  48 year old male with chronic abdominal pain reports worsening pain intermittently over the past 3 weeks.  Pain seemingly worsened today while he was at work.  Some nausea, no vomiting.  Took his Zofran with little improvement.  Denies fevers, chills, chest pain, shortness of breath.  Reports daily bowel movements that are quite soft.  No blood.   Abdominal Pain      Home Medications Prior to Admission medications   Medication Sig Start Date End Date Taking? Authorizing Provider  cholecalciferol (VITAMIN D3) 25 MCG (1000 UNIT) tablet Take 1,000 Units by mouth daily.    [provider]  dicyclomine (BENTYL) 20 MG tablet Take 1 tablet (20 mg total) by mouth 3 (three) times daily as needed for spasms. And abdominal cramps 10/26/22   Lynann Bologna, MD  hyoscyamine (LEVSIN SL) 0.125 MG SL tablet Place 1 tablet (0.125 mg total) under the tongue every 6 (six) hours as needed. 03/24/22   Lynann Bologna, MD  loratadine (CLARITIN) 10 MG tablet Take 10 mg by mouth daily.    [provider]  omeprazole (PRILOSEC) 20 MG capsule Take 1 capsule (20 mg total) by mouth daily. Every AM 10/26/22   Lynann Bologna, MD  ondansetron (ZOFRAN-ODT) 4 MG disintegrating tablet Take 1 tablet (4 mg total) by mouth every 8 (eight) hours as needed for nausea or vomiting. 04/14/23   Lynann Bologna, MD  zinc sulfate 220 (50 Zn) MG capsule Take 220 mg by mouth daily.    [provider]      Allergies    Patient has no known allergies.    Review of Systems   Review of Systems  Gastrointestinal:  Positive for abdominal pain.    Physical Exam Updated Vital Signs BP 113/81   Pulse 72   Temp 97.9 F (36.6 C) (Oral)   Resp 18   Ht 6\' 1"  (1.854 m)    Wt 93 kg   SpO2 97%   BMI 27.05 kg/m  Physical Exam Vitals and nursing note reviewed.  Constitutional:      General: He is not in acute distress.    Appearance: He is not toxic-appearing.  HENT:     Head: Normocephalic.  Cardiovascular:     Rate and Rhythm: Normal rate and regular rhythm.  Pulmonary:     Effort: Pulmonary effort is normal.  Abdominal:     General: Abdomen is flat.     Tenderness: There is generalized abdominal tenderness.  Neurological:     General: No focal deficit present.     Mental Status: He is alert.  Psychiatric:        Mood and Affect: Mood normal.        Behavior: Behavior normal.     ED Results / Procedures / Treatments   Labs (all labs ordered are listed, but only abnormal results are displayed) Labs Reviewed  COMPREHENSIVE METABOLIC PANEL WITH GFR - Abnormal; Notable for the following components:      Result Value   Glucose, Bld 100 (*)    Total Bilirubin 2.4 (*)    All other components within normal limits  LIPASE, BLOOD  CBC  URINALYSIS, ROUTINE W REFLEX MICROSCOPIC    EKG None  Radiology CT ABDOMEN PELVIS W CONTRAST Result Date: 04/21/2023 CLINICAL DATA:  Bowel obstruction suspected EXAM: CT ABDOMEN AND PELVIS WITH CONTRAST TECHNIQUE: Multidetector CT imaging of the abdomen and pelvis was performed using the standard protocol following bolus administration of intravenous contrast. RADIATION DOSE REDUCTION: This exam was performed according to the departmental dose-optimization program which includes automated exposure control, adjustment of the mA and/or kV according to patient size and/or use of iterative reconstruction technique. CONTRAST:  75mL OMNIPAQUE IOHEXOL 350 MG/ML SOLN COMPARISON:  CT scan abdomen and pelvis from 03/23/2022. FINDINGS: Lower chest: The lung bases are clear. No pleural effusion. The heart is normal in size. No pericardial effusion. Hepatobiliary: The liver is normal in size. Non-cirrhotic configuration. No  suspicious mass. These is mild diffuse hepatic steatosis. No intrahepatic or extrahepatic bile duct dilation. Gallbladder is surgically absent. Pancreas: Unremarkable. No pancreatic ductal dilatation or surrounding inflammatory changes. Spleen: Within normal limits. No focal lesion. Adrenals/Urinary Tract: Adrenal glands are unremarkable. No suspicious renal mass. No hydronephrosis. No renal or ureteric calculi. Unremarkable urinary bladder. Stomach/Bowel: There are 2 small diverticula arising from the distal duodenum. No disproportionate dilation of the small or large bowel loops. No evidence of abnormal bowel wall thickening or inflammatory changes. The appendix is unremarkable. There are multiple diverticula mainly in the left hemi colon, without imaging signs of diverticulitis. Vascular/Lymphatic: No ascites or pneumoperitoneum. No abdominal or pelvic lymphadenopathy, by size criteria. No aneurysmal dilation of the major abdominal arteries. Reproductive: Normal size prostate. Symmetric seminal vesicles. Other: The visualized soft tissues and abdominal wall are unremarkable. Musculoskeletal: No suspicious osseous lesions. IMPRESSION: *No acute inflammatory process identified within the abdomen or pelvis. No bowel obstruction. *Multiple other nonacute observations, as described above. Electronically Signed   By: Jules Schick M.D.   On: 04/21/2023 15:48    Procedures Procedures    Medications Ordered in ED Medications  morphine (PF) 2 MG/ML injection 2 mg (2 mg Intravenous Given 04/21/23 1348)  dicyclomine (BENTYL) capsule 10 mg (10 mg Oral Given 04/21/23 1347)  alum & mag hydroxide-simeth (MAALOX/MYLANTA) 200-200-20 MG/5ML suspension 30 mL (30 mLs Oral Given 04/21/23 1347)  iohexol (OMNIPAQUE) 350 MG/ML injection 75 mL (75 mLs Intravenous Contrast Given 04/21/23 1437)    ED Course/ Medical Decision Making/ A&P Clinical Course as of 04/21/23 1631  Thu Apr 21, 2023  1254 Ct abd 03/23/22 per chart review: "   IMPRESSION: 1. No CT evidence of acute abdominal/pelvic process. 2. Scattered colonic diverticulosis without evidence of acute diverticulitis. 3. Status post cholecystectomy. " [TY]  1552 CT ABDOMEN PELVIS W CONTRAST IMPRESSION: *No acute inflammatory process identified within the abdomen or pelvis. No bowel obstruction. *Multiple other nonacute observations, as described above.   Electronically Signed   By: Jules Schick M.D.   [TY]    Clinical Course User Index [TY] Coral Spikes, DO                                 Medical Decision Making This is a 48 year old male presenting emergency department for generalized abdominal pain.  Afebrile vital signs reassuring.  Some minor tenderness, but largely benign abdominal exam.  See below for chart review.  Follows with GI.  Workup today reassuring no fever tachycardia leukocytosis.  No metabolic derangements.  No transaminitis that would suggest hepatobiliary disease.  Does have mildly elevated T. bili, but is improved compared to prior.  Not having right upper quadrant abdominal  pain.  Lipase normal.  Pancreatitis unlikely.  UA negative for acute urinary tract infection.  CAT scan today without acute pathology.  Treated with morphine, GI cocktail and Bentyl with some improvement of symptoms.  Discussed findings with patient.  Stable for discharge to follow-up with GI.  Amount and/or Complexity of Data Reviewed Independent Historian:     Details: Spouse notes daily bowel movements External Data Reviewed:     Details: Per note with GI #1. GERD with EE.    #2. IBS-D.  With postcholecystectomy diarrhea. Also assoc alpha gal. Neg stool for GI pathogen, C. Difficile, calprotectin, fecal elastase. Neg colon to TI with random TI and colonic bx 08/2021   #3. H/O sig diverticulitis June 2022, treated with Augmentin then complicated by C. difficile colitis requiring vancomycin.   Plan:   -Bentyl 20mg  po TID #90, 2RF -Omerazole 20mg  po  every day #90 -Continue zofran 4mg  ODT prn #20 -Continue acupuncture. - FU 1 year.  Labs: ordered. Decision-making details documented in ED Course. Radiology: ordered and independent interpretation performed. Decision-making details documented in ED Course.    Details: I do not appreciate obvious obstruction on my independent review  Risk OTC drugs. Prescription drug management. Parenteral controlled substances. Decision regarding hospitalization.         Final Clinical Impression(s) / ED Diagnoses Final diagnoses:  None    Rx / DC Orders ED Discharge Orders     None         Coral Spikes, DO 04/21/23 1631

## 2023-04-21 NOTE — ED Notes (Signed)
 Pt d/c home per EDP order. Discharge summary reviewed, verbalize understanding. NAD. Discharged home with wife.

## 2023-04-21 NOTE — ED Triage Notes (Signed)
 Pt. Stated, it hit me this morning around 900. Ive had nausea but took my medicine for that. Ive had stomach issues for over a year. I have everything known that's wrong for my stomach.

## 2023-04-21 NOTE — Discharge Instructions (Signed)
 Please follow-up with your primary doctor and your gastroenterologist.  Return immediately for fevers, chills, inability eat or drink due to nausea vomiting, stop having bowel movements, severe pain or any new or worsening symptoms that are concerning to you.  You may take Percocet for breakthrough pain.  You may also try Reglan if your nausea is not controlled with your home Zofran.

## 2023-04-22 ENCOUNTER — Encounter: Payer: Self-pay | Admitting: Gastroenterology

## 2023-05-10 ENCOUNTER — Other Ambulatory Visit: Payer: Self-pay | Admitting: Gastroenterology

## 2023-05-10 DIAGNOSIS — K219 Gastro-esophageal reflux disease without esophagitis: Secondary | ICD-10-CM

## 2023-05-26 ENCOUNTER — Other Ambulatory Visit: Payer: Self-pay | Admitting: Gastroenterology

## 2023-06-11 ENCOUNTER — Other Ambulatory Visit: Payer: Self-pay | Admitting: Gastroenterology

## 2023-06-11 DIAGNOSIS — K219 Gastro-esophageal reflux disease without esophagitis: Secondary | ICD-10-CM

## 2023-06-16 ENCOUNTER — Encounter: Payer: Self-pay | Admitting: Family Medicine

## 2023-07-08 ENCOUNTER — Other Ambulatory Visit: Payer: Self-pay | Admitting: Gastroenterology

## 2023-07-08 DIAGNOSIS — K219 Gastro-esophageal reflux disease without esophagitis: Secondary | ICD-10-CM

## 2023-08-21 ENCOUNTER — Other Ambulatory Visit: Payer: Self-pay | Admitting: Gastroenterology

## 2023-09-18 ENCOUNTER — Other Ambulatory Visit: Payer: Self-pay | Admitting: Gastroenterology

## 2023-11-01 ENCOUNTER — Encounter: Payer: Self-pay | Admitting: Gastroenterology
